# Patient Record
Sex: Male | Born: 1995 | Race: White | Hispanic: No | Marital: Single | Smoking: Never smoker
Health system: Southern US, Community
[De-identification: ages and names within clinical notes are randomized; demographics above are authoritative.]

## PROBLEM LIST (undated history)

## (undated) DIAGNOSIS — F32A Depression, unspecified: Secondary | ICD-10-CM

## (undated) DIAGNOSIS — F419 Anxiety disorder, unspecified: Secondary | ICD-10-CM

## (undated) DIAGNOSIS — I341 Nonrheumatic mitral (valve) prolapse: Secondary | ICD-10-CM

## (undated) DIAGNOSIS — F845 Asperger's syndrome: Secondary | ICD-10-CM

## (undated) DIAGNOSIS — F329 Major depressive disorder, single episode, unspecified: Secondary | ICD-10-CM

## (undated) DIAGNOSIS — T7840XA Allergy, unspecified, initial encounter: Secondary | ICD-10-CM

## (undated) DIAGNOSIS — K219 Gastro-esophageal reflux disease without esophagitis: Secondary | ICD-10-CM

## (undated) DIAGNOSIS — R55 Syncope and collapse: Secondary | ICD-10-CM

## (undated) DIAGNOSIS — Q676 Pectus excavatum: Secondary | ICD-10-CM

## (undated) DIAGNOSIS — R569 Unspecified convulsions: Secondary | ICD-10-CM

## (undated) HISTORY — DX: Anxiety disorder, unspecified: F41.9

## (undated) HISTORY — DX: Pectus excavatum: Q67.6

## (undated) HISTORY — DX: Depression, unspecified: F32.A

## (undated) HISTORY — DX: Asperger's syndrome: F84.5

## (undated) HISTORY — DX: Unspecified convulsions: R56.9

## (undated) HISTORY — DX: Major depressive disorder, single episode, unspecified: F32.9

## (undated) HISTORY — DX: Nonrheumatic mitral (valve) prolapse: I34.1

## (undated) HISTORY — DX: Gastro-esophageal reflux disease without esophagitis: K21.9

## (undated) HISTORY — DX: Allergy, unspecified, initial encounter: T78.40XA

## (undated) HISTORY — DX: Syncope and collapse: R55

---
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
## 2014-03-25 ENCOUNTER — Encounter: Payer: Self-pay | Admitting: Internal Medicine

## 2014-03-25 ENCOUNTER — Ambulatory Visit (INDEPENDENT_AMBULATORY_CARE_PROVIDER_SITE_OTHER): Payer: 59 | Admitting: Internal Medicine

## 2014-03-25 VITALS — BP 120/70 | HR 109 | Temp 97.5°F | Wt 153.0 lb

## 2014-03-25 DIAGNOSIS — H9190 Unspecified hearing loss, unspecified ear: Secondary | ICD-10-CM

## 2014-03-25 DIAGNOSIS — H9193 Unspecified hearing loss, bilateral: Secondary | ICD-10-CM

## 2014-03-25 NOTE — Progress Notes (Signed)
   Subjective:    Patient ID: Charles Copeland, male    DOB: 09/06/1995, 18 y.o.   MRN: [id number]  HPI Here with mom Has been going to the new counselor (the computer addiction expert) Has agreed he won't play computer games if grades under B  No classes this summer Quit job at [employer]  Has license and will be getting car Parents plan to have him move out and get job if he fails any more  Diagnosed with OCD as well as depression and anxiety--by the psychiatrist On the stimulant with some improvement  Maternal aunt recently had sudden hearing loss in one ear GF has hearing issues---but had shot guns  Some concern about his hearing--doesn't seem to hear parents when they talk to him Had repetitive OM as child--better after tonsils and adenoids Games with ear plugs  Current Outpatient Prescriptions on File Prior to Visit  Medication Sig Dispense Refill  . ALPRAZolam (XANAX) 0.25 MG tablet Take 1 tablet (0.25 mg total) by mouth 2 (two) times daily as needed for anxiety.  20 tablet  0  . cholecalciferol (VITAMIN D) 1000 UNITS tablet Take 1,000 Units by mouth daily.      Marland Kitchen. escitalopram (LEXAPRO) 20 MG tablet Take 1 tablet (20 mg total) by mouth daily.  90 tablet  3  . Multiple Vitamin (MULTIVITAMIN WITH MINERALS) TABS Take 1 tablet by mouth daily.       No current facility-administered medications on file prior to visit.    No Known Allergies  Past Medical History  Diagnosis Date  . Allergy   . Asthma   . Depression   . Anxiety   . MVP (mitral valve prolapse)     Past Surgical History  Procedure Laterality Date  . Tonsillectomy and adenoidectomy  2007  . Orif radial fracture  03/09    Left proximal   . Nasal cautery  02/09    in OR  . Nasal septum surgery  7/13    [facility]--Dr Evans    Family History  Problem Relation Age of Onset  . Asthma Mother   . GER disease Mother   . Eczema Mother   . Allergies Mother   . Alcohol abuse Maternal Grandfather     ?  bipolar  . Cancer Paternal Grandfather     LIVER    History   Social History  . Marital Status: Single    Spouse Name: N/A    Number of Children: N/A  . Years of Education: N/A   Occupational History  . Not on file.   Social History Main Topics  . Smoking status: Never Smoker   . Smokeless tobacco: Never Used  . Alcohol Use: No  . Drug Use: No  . Sexual Activity: Not on file   Other Topics Concern  . Not on file   Social History Narrative   Mother: retired Optometristediatrician   Father: Lab director-[employer]   One brother   No smokers in house   Review of Systems Occasional tinnitus ?vertigo sensation looking at certain patterns    Objective:   Physical Exam  Constitutional: He appears well-developed and well-nourished. No distress.  HENT:  TMs and canals normal  Eyes:  No nystagmus          Assessment & Plan:

## 2014-03-25 NOTE — Assessment & Plan Note (Signed)
Has normal hearing Seems to be a matter of attention and concentration Reassured about the status of his ears

## 2014-08-20 ENCOUNTER — Encounter: Payer: Self-pay | Admitting: Internal Medicine

## 2014-08-20 ENCOUNTER — Ambulatory Visit (INDEPENDENT_AMBULATORY_CARE_PROVIDER_SITE_OTHER): Payer: 59 | Admitting: Internal Medicine

## 2014-08-20 VITALS — BP 110/60 | HR 95 | Temp 98.4°F | Wt 176.0 lb

## 2014-08-20 DIAGNOSIS — R197 Diarrhea, unspecified: Secondary | ICD-10-CM

## 2014-08-20 DIAGNOSIS — F324 Major depressive disorder, single episode, in partial remission: Secondary | ICD-10-CM

## 2014-08-20 MED ORDER — HYOSCYAMINE SULFATE 0.125 MG SL SUBL: 0.1250 mg | SUBLINGUAL_TABLET | Freq: Two times a day (BID) | SUBLINGUAL | Status: DC | PRN

## 2014-08-20 NOTE — Patient Instructions (Signed)
Please try fiber supplements like metamucil or fibercon.  Irritable Bowel Syndrome Irritable bowel syndrome (IBS) is caused by a disturbance of normal bowel function and is a common digestive disorder. You may also hear this condition called spastic colon, mucous colitis, and irritable colon. There is no cure for IBS. However, symptoms often gradually improve or disappear with a good diet, stress management, and medicine. This condition usually appears in late adolescence or early adulthood. Women develop it twice as often as men. CAUSES  After food has been digested and absorbed in the small intestine, waste material is moved into the large intestine, or colon. In the colon, water and salts are absorbed from the undigested products coming from the small intestine. The remaining residue, or fecal material, is held for elimination. Under normal circumstances, gentle, rhythmic contractions of the bowel walls push the fecal material along the colon toward the rectum. In IBS, however, these contractions are irregular and poorly coordinated. The fecal material is either retained too long, resulting in constipation, or expelled too soon, producing diarrhea. SIGNS AND SYMPTOMS  The most common symptom of IBS is abdominal pain. It is often in the lower left side of the abdomen, but it may occur anywhere in the abdomen. The pain comes from spasms of the bowel muscles happening too much and from the buildup of gas and fecal material in the colon. This pain:  Can range from sharp abdominal cramps to a dull, continuous ache.  Often worsens soon after eating.  Is often relieved by having a bowel movement or passing gas. Abdominal pain is usually accompanied by constipation, but it may also produce diarrhea. The diarrhea often occurs right after a meal or upon waking up in the morning. The stools are often soft, watery, and flecked with mucus. Other symptoms of IBS include:  Bloating.  Loss of  appetite.  Heartburn.  Backache.  Dull pain in the arms or shoulders.  Nausea.  Burping.  Vomiting.  Gas. IBS may also cause symptoms that are unrelated to the digestive system, such as:  Fatigue.  Headaches.  Anxiety.  Shortness of breath.  Trouble concentrating.  Dizziness. These symptoms tend to come and go. DIAGNOSIS  The symptoms of IBS may seem like symptoms of other, more serious digestive disorders. Your health care provider may want to perform tests to exclude these disorders.  TREATMENT Many medicines are available to help correct bowel function or relieve bowel spasms and abdominal pain. Among the medicines available are:  Laxatives for severe constipation and to help restore normal bowel habits.  Specific antidiarrheal medicines to treat severe or lasting diarrhea.  Antispasmodic agents to relieve intestinal cramps. Your health care provider may also decide to treat you with a mild tranquilizer or sedative during unusually stressful periods in your life. Your health care provider may also prescribe antidepressant medicine. The use of this medicine has been shown to reduce pain and other symptoms of IBS. Remember that if any medicine is prescribed for you, you should take it exactly as directed. Make sure your health care provider knows how well it worked for you. HOME CARE INSTRUCTIONS   Take all medicines as directed by your health care provider.  Avoid foods that are high in fat or oils, such as heavy cream, butter, frankfurters, sausage, and other fatty meats.  Avoid foods that make you go to the bathroom, such as fruit, fruit juice, and dairy products.  Cut out carbonated drinks, chewing gum, and "gassy" foods such as beans  and cabbage. This may help relieve bloating and burping.  Eat foods with bran, and drink plenty of liquids with the bran foods. This helps relieve constipation.  Keep track of what foods seem to bring on your symptoms.  Avoid  emotionally charged situations or circumstances that produce anxiety.  Start or continue exercising.  Get plenty of rest and sleep. Document Released: 08/22/2005 Document Revised: 08/27/2013 Document Reviewed: 04/11/2008 [facility] Patient Information 2015 [employer], [place]. This information is not intended to replace advice given to you by your health care provider. Make sure you discuss any questions you have with your health care provider.

## 2014-08-20 NOTE — Assessment & Plan Note (Signed)
Going on for a few months Not infectious No worry symptoms or signs of IBD---despite brother's history Sounds like IBS type situation May be related to stress but overall doing well---- job, applied and [facility] [employer], etc Don't think it is related to his meds (not new)  Discussed increased fiber Will give levsin for SL emergency use

## 2014-08-20 NOTE — Assessment & Plan Note (Signed)
Finally still seems to improving Mom is encouraged Satisfied with the psychiatrist

## 2014-08-20 NOTE — Progress Notes (Signed)
Subjective:    Patient : Charles RoanDaniel A Meding, male    DOB: 10/31/1995, 18 y.o.   MRN: [id number]  HPI Here with mom to evaluate feeling sick  Micah FlesherWent to work at [employer] Had to go home due to diarrhea Stomach is acting up  Not new according to mom 1-2 times per week--usually just in the morning No nausea or vomiting ?cramping--rectal urgency Mom has noticed some severe staining at times but he denies true incontinence No blood Cramping will be relieved by going--but then come back  Can occur at home or at work Not stressed out   No problems at night 1-2 spells of loose stool with the spells  Brother has Crohn's  Doesn't feel depressed now [facility] [employer] [facility] [employer] [facility]  Eating healthy Reasonable fiber intake  Current Outpatient Prescriptions on File Prior to Visit  Medication Sig Dispense Refill  . amphetamine-dextroamphetamine (ADDERALL) 10 MG tablet Take 10 mg by mouth 2 (two) times daily with a meal.     . cholecalciferol (VITAMIN D) 1000 UNITS tablet Take 1,000 Units by mouth daily.    Marland Kitchen. escitalopram (LEXAPRO) 20 MG tablet Take 1 tablet (20 mg total) by mouth daily. 90 tablet 3  . Multiple Vitamin (MULTIVITAMIN WITH MINERALS) TABS Take 1 tablet by mouth daily.     No current facility-administered medications on file prior to visit.    Allergies  Allergen Reactions  . Seroquel [Quetiapine Fumarate] Swelling    Excessive appetite     Past Medical History  Diagnosis Date  . Allergy   . Asthma   . Depression   . Anxiety   . MVP (mitral valve prolapse)     Past Surgical History  Procedure Laterality Date  . Tonsillectomy and adenoidectomy  2007  . Orif radial fracture  03/09    Left proximal   . Nasal cautery  02/09    in OR  . Nasal septum surgery  7/13    [facility]--Dr Evans    Family History  Problem Relation Age of Onset  . Asthma Mother   . GER disease Mother   . Eczema Mother   . Allergies Mother   . Alcohol abuse Maternal Grandfather    ? bipolar  . Cancer Paternal Grandfather     LIVER    History   Social History  . Marital Status: Single    Spouse Name: N/A    Number of Children: N/A  . Years of Education: N/A   Occupational History  . Not on file.   Social History Main Topics  . Smoking status: Never Smoker   . Smokeless tobacco: Never Used  . Alcohol Use: No  . Drug Use: No  . Sexual Activity: Not on file   Other Topics Concern  . Not on file   Social History Narrative   Mother: retired Optometristediatrician   Father: Lab director-[employer]   One brother   No smokers in house   Review of Systems Appetite up from meds---especially during brief time on zyprexa. Personality off then also Face and neck swelled also. Stopped after a week Weight is up 20# from last visit  Has been on abilify for some time--- had no GI problems with this for some time No fever Very tired--can't stay up. Has to be up at 4AM for his job    Objective:   Physical Exam  Constitutional: He appears well-nourished. No distress.  Neck: Normal range of motion. Neck supple. No thyromegaly present.  Pulmonary/Chest:  Effort normal and breath sounds normal. No respiratory distress. He has no wheezes. He has no rales.  Abdominal: Soft. Bowel sounds are normal. He exhibits no distension. There is no tenderness. There is no rebound and no guarding.          Assessment & Plan:

## 2014-08-20 NOTE — Progress Notes (Signed)
Pre visit review using our clinic review tool, if applicable. No additional management support is needed unless otherwise documented below in the visit note. 

## 2014-12-15 ENCOUNTER — Ambulatory Visit (INDEPENDENT_AMBULATORY_CARE_PROVIDER_SITE_OTHER): Payer: 59 | Admitting: Internal Medicine

## 2014-12-15 ENCOUNTER — Encounter: Payer: Self-pay | Admitting: Internal Medicine

## 2014-12-15 VITALS — BP 104/68 | HR 94 | Temp 98.5°F | Wt 187.2 lb

## 2014-12-15 DIAGNOSIS — M79671 Pain in right foot: Secondary | ICD-10-CM

## 2014-12-15 NOTE — Progress Notes (Signed)
Pre visit review using our clinic review tool, if applicable. No additional management support is needed unless otherwise documented below in the visit note. 

## 2014-12-15 NOTE — Progress Notes (Signed)
Subjective:    Patient ID: Charles Copeland, male    DOB: 10/04/1995, 19 y.o.   MRN: [id number]  HPI Here with mom due to right foot pain for a week  No known injury Soreness when trying to walk on it Mainly at distal base of metatarsals 2-4 Had blister over Achilles a week ago--like shoe caused it  Taking ibuprofen 400mg  up to tid--this has helped  It is tender Has been walking every morning per agreement with parents New walking shoes--but doesn't really like them Mows lawn in boots  Current Outpatient Prescriptions on File Prior to Visit  Medication Sig Dispense Refill  . amphetamine-dextroamphetamine (ADDERALL) 10 MG tablet Take 10 mg by mouth 2 (two) times daily with a meal.     . ARIPiprazole (ABILIFY) 5 MG tablet Take 5 mg by mouth daily.    . cholecalciferol (VITAMIN D) 1000 UNITS tablet Take 1,000 Units by mouth daily.    Marland Kitchen. escitalopram (LEXAPRO) 10 MG tablet Take 10 mg by mouth daily. Take along with the 20mg  tablet once daily    . escitalopram (LEXAPRO) 20 MG tablet Take 1 tablet (20 mg total) by mouth daily. 90 tablet 3  . Multiple Vitamin (MULTIVITAMIN WITH MINERALS) TABS Take 1 tablet by mouth daily.    . hyoscyamine (LEVSIN SL) 0.125 MG SL tablet Place 1 tablet (0.125 mg total) under the tongue 2 (two) times daily as needed. (Patient not taking: Reported on 12/15/2014) 60 tablet 0   No current facility-administered medications on file prior to visit.    Allergies  Allergen Reactions  . Remeron [Mirtazapine] Other (See Comments)    Hypotension, passed out  . Seroquel [Quetiapine Fumarate] Swelling    Excessive appetite     Past Medical History  Diagnosis Date  . Allergy   . Asthma   . Depression   . Anxiety   . MVP (mitral valve prolapse)     Past Surgical History  Procedure Laterality Date  . Tonsillectomy and adenoidectomy  2007  . Orif radial fracture  03/09    Left proximal   . Nasal cautery  02/09    in OR  . Nasal septum surgery  7/13   [facility]--Dr Evans    Family History  Problem Relation Age of Onset  . Asthma Mother   . GER disease Mother   . Eczema Mother   . Allergies Mother   . Alcohol abuse Maternal Grandfather     ? bipolar  . Cancer Paternal Grandfather     LIVER    History   Social History  . Marital Status: Single    Spouse Name: N/A  . Number of Children: N/A  . Years of Education: N/A   Occupational History  . Not on file.   Social History Main Topics  . Smoking status: Never Smoker   . Smokeless tobacco: Never Used  . Alcohol Use: No  . Drug Use: No  . Sexual Activity: Not on file   Other Topics Concern  . Not on file   Social History Narrative   Mother: retired Optometristediatrician   Father: Lab director-[employer]   One brother   No smokers in house   Review of Systems  No fever No other joint pain or swelling     Objective:   Physical Exam  Constitutional: He appears well-developed and well-nourished. No distress.  Musculoskeletal:  Puffy in right foot but no edema No tenderness along ankle or MTP/toes Mild tenderness proximal to 2nd -  4th MTPs Small pimple over Achillles tendon but not inflamed or tender          Assessment & Plan:

## 2014-12-15 NOTE — Assessment & Plan Note (Signed)
Seems mechanical Discussed using the better, new walking shoes Try bike for a while to let discomfort settle down Continue ibuprofen prn

## 2014-12-18 ENCOUNTER — Ambulatory Visit: Payer: Self-pay | Admitting: Internal Medicine

## 2014-12-19 ENCOUNTER — Ambulatory Visit (INDEPENDENT_AMBULATORY_CARE_PROVIDER_SITE_OTHER)
Admission: RE | Admit: 2014-12-19 | Discharge: 2014-12-19 | Disposition: A | Discharge status: Home / Self Care | Payer: 59 | Source: Ambulatory Visit | Attending: Family Medicine | Admitting: Family Medicine

## 2014-12-19 ENCOUNTER — Telehealth: Payer: Self-pay | Admitting: Internal Medicine

## 2014-12-19 ENCOUNTER — Ambulatory Visit (INDEPENDENT_AMBULATORY_CARE_PROVIDER_SITE_OTHER): Payer: 59 | Admitting: Family Medicine

## 2014-12-19 ENCOUNTER — Encounter: Payer: Self-pay | Admitting: Family Medicine

## 2014-12-19 VITALS — BP 118/70 | HR 101 | Temp 98.4°F | Ht 73.0 in | Wt 185.5 lb

## 2014-12-19 DIAGNOSIS — M79671 Pain in right foot: Secondary | ICD-10-CM

## 2014-12-19 NOTE — Telephone Encounter (Signed)
Please call mom Lurena JoinerRebecca with xray results at [phone]

## 2014-12-19 NOTE — Progress Notes (Signed)
Pre visit review using our clinic review tool, if applicable. No additional management support is needed unless otherwise documented below in the visit note. 

## 2014-12-19 NOTE — Addendum Note (Signed)
Addended byKerby Nora: Yoshi Vicencio E on: 12/19/2014 10:05 AM   Modules accepted: Orders

## 2014-12-19 NOTE — Progress Notes (Signed)
   Subjective:    Patient ID: Charles Copeland, male    DOB: 09/13/1995, 19 y.o.   MRN: [id number]  HPI  19 year old male pt of Dr. Karle StarchLetvak's presents with his mother for continued pain in right foot. Not getting any better. Mom feels that it may be getting worse, he has been complaining more about it.  He saw his PCP on 4/11 for similar pain  Right foot pain has been going on for  2-3 weeks now.  No known injury No redness, no warmth. Soreness when trying to walk on it. Walks barefoot at home most of time. Mainly at distal base of metatarsals 2-4 or dorsal surface. Had blister over Achilles a week ago-- shoe caused it  Has been walking every morning per agreement with parents in last few months. New walking shoes--but doesn't really like them.. Per Dr. Karle StarchLetvak's instructions he has been wearing the better shoes.  Taking ibuprofen 400mg  up to BID--this has helped.  No other joint issues. No myalgia.    No clear med SE but Abilify can cause arthralgia No family or personal history of gout.   Review of Systems  Constitutional: Negative for fever and fatigue.  Respiratory: Negative for shortness of breath.   Cardiovascular: Negative for chest pain and leg swelling.  Gastrointestinal: Negative for abdominal pain.       Objective:   Physical Exam  Constitutional: Vital signs are normal. He appears well-developed and well-nourished.  HENT:  Head: Normocephalic.  Right Ear: Hearing normal.  Left Ear: Hearing normal.  Mouth/Throat: Mucous membranes are normal.  Neck: Trachea normal. Carotid bruit is not present. No thyroid mass present.  Cardiovascular: Normal rate, regular rhythm and normal pulses.  Exam reveals no gallop, no distant heart sounds and no friction rub.   No murmur heard. No peripheral edema  Pulmonary/Chest: Effort normal and breath sounds normal. No respiratory distress.  Musculoskeletal:       Right ankle: Normal. He exhibits normal range of motion. No  tenderness.       Left ankle: Normal. He exhibits normal range of motion. No tenderness.       Right foot: There is tenderness, bony tenderness and swelling. There is normal range of motion and normal capillary refill.       Left foot: Normal.       Feet:  Circled area is area of tenderness, mild swelling , minimal erythema and warmth, no hypersensitivity.  Skin: Skin is warm, dry and intact. No rash noted.  Psychiatric: He has a normal mood and affect. His speech is normal and behavior is normal. Thought content normal.          Assessment & Plan:

## 2014-12-19 NOTE — Patient Instructions (Signed)
Increase ibuprofen 800 mg every 8 hours. We will call with X-ray results. Limit weight bearing until X-rays are back.

## 2014-12-19 NOTE — Assessment & Plan Note (Signed)
Not clearly gout.  Minimal improvement with better shoes and low dose NSAID. Diff Dx includes stress fracture, metatarsalgia ( although no pain or sole of foot), ligament strain, less likely gout.  Will start with X-rays and increase dose of ibuprofen to 800 mg every 8 hours for pain.

## 2014-12-20 NOTE — Progress Notes (Signed)
I spoke to mom today after seeing note and x-ray May need to consider Morton's neuroma--though I wouldn't recommend a rush to surgery

## 2014-12-22 ENCOUNTER — Ambulatory Visit (INDEPENDENT_AMBULATORY_CARE_PROVIDER_SITE_OTHER): Payer: 59

## 2014-12-22 ENCOUNTER — Ambulatory Visit (INDEPENDENT_AMBULATORY_CARE_PROVIDER_SITE_OTHER): Payer: 59 | Admitting: Podiatry

## 2014-12-22 VITALS — BP 114/75 | HR 77 | Resp 16 | Ht 73.0 in | Wt 185.0 lb

## 2014-12-22 DIAGNOSIS — M79671 Pain in right foot: Secondary | ICD-10-CM

## 2014-12-22 DIAGNOSIS — M84374A Stress fracture, right foot, initial encounter for fracture: Secondary | ICD-10-CM | POA: Diagnosis not present

## 2014-12-22 NOTE — Progress Notes (Signed)
   Subjective:    Patient ID: Charles Copeland, male    DOB: 01/22/1996, 19 y.o.   MRN: [id number]  HPI It started 3-4 weeks ago , all of a sudden the right foot started hurting and it has started to swell.  No injury to foot, no redness or bruising.  It is pretty sore on the second toe top of foot   Review of Systems  All other systems reviewed and are negative.      Objective:   Physical Exam: I have reviewed his past medical history medications allergies surgery social history and review of systems. Pulses are strongly palpable bilateral. Her logic sensorium is intact for since was C monofilament. Deep tendon reflexes are intact bilateral and muscle strength is 5 over 5 dorsiflexion plantar flexors and inverters everters all intrinsic musculature is intact. Orthopedic evaluation demonstrates all joints distal to the ankle before range of motion without crepitus he has pinpoint tenderness overlying the second metatarsal neck with a painful vibratory sensation right foot. Radiographic evaluation does not demonstrate any type of osseus abnormalities other than a soft tissue increase in density around the mid diaphyseal to metaphyseal region of the second metatarsal right foot.        Assessment & Plan:  Assessment: Probable stress fracture second metatarsal right foot.  Plan: Placed him in a Cam Walker with a compression anklet.

## 2015-01-19 ENCOUNTER — Ambulatory Visit (INDEPENDENT_AMBULATORY_CARE_PROVIDER_SITE_OTHER): Payer: 59

## 2015-01-19 ENCOUNTER — Ambulatory Visit (INDEPENDENT_AMBULATORY_CARE_PROVIDER_SITE_OTHER): Payer: 59 | Admitting: Podiatry

## 2015-01-19 VITALS — BP 114/76 | HR 86 | Resp 16

## 2015-01-19 DIAGNOSIS — M722 Plantar fascial fibromatosis: Secondary | ICD-10-CM

## 2015-01-19 DIAGNOSIS — M8430XD Stress fracture, unspecified site, subsequent encounter for fracture with routine healing: Secondary | ICD-10-CM

## 2015-01-19 NOTE — Progress Notes (Signed)
He presents today with his mother for follow-up of his second metatarsal of the right foot which had a stress fracture. He states that he seems to be doing much better with very little pain. He does state that he has pain on medial aspect of his ankles bilaterally and his heels.  Objective: Vital signs are stable alert and oriented 3. Reviewed his past history medications and allergies. Pulses are palpable to the bilateral foot he has mild tenderness on palpation along the medial longitudinal arch and the posterior tibial tendon of the bilateral ankles particularly on the right foot. He also has tenderness on palpation of the distal diaphyseal region of the second metatarsal right foot. Minimal edema to the right foot but more than the left. Radiographic evaluation confirms well-healing bone callus second metatarsal distal diaphyseal region. Right foot.  Assessment: Well-healing fracture second metatarsal right foot. Mild pes planus plantar fasciitis bilateral.  Plan: Discussed etiology pathology conservative versus surgical therapies I encouraged orthotics and I will follow-up with him once those come in. Otherwise he can get back into his regular shoe gear.

## 2015-02-09 ENCOUNTER — Telehealth: Payer: Self-pay | Admitting: *Deleted

## 2015-02-09 NOTE — Telephone Encounter (Signed)
Patients mother called to check status of orthtoics that were supposed to have been ordered in early May.  Please call.

## 2015-02-10 NOTE — Telephone Encounter (Signed)
Orthotics are in appointment made

## 2015-02-13 ENCOUNTER — Ambulatory Visit: Payer: 59 | Admitting: Podiatry

## 2015-02-13 ENCOUNTER — Ambulatory Visit (INDEPENDENT_AMBULATORY_CARE_PROVIDER_SITE_OTHER): Payer: 59 | Admitting: Podiatry

## 2015-02-13 DIAGNOSIS — M722 Plantar fascial fibromatosis: Secondary | ICD-10-CM

## 2015-02-13 NOTE — Patient Instructions (Signed)

## 2015-02-13 NOTE — Progress Notes (Signed)
He presents today to take up his orthotics. He was given both oral and written instructions for use in care of the orthotics we will follow-up with him in 1 month

## 2015-03-08 IMAGING — CR DG CHEST 2V
2 series · 2 of 2 positions shown · non-contrast
Comparison: 10/04/2007

CLINICAL DATA: Loss of consciousness.  Syncope.  History of mitral
valve prolapse and asthma.  Prior syncopal episodes.

CHEST - 2 VIEW

[w chest pa *]
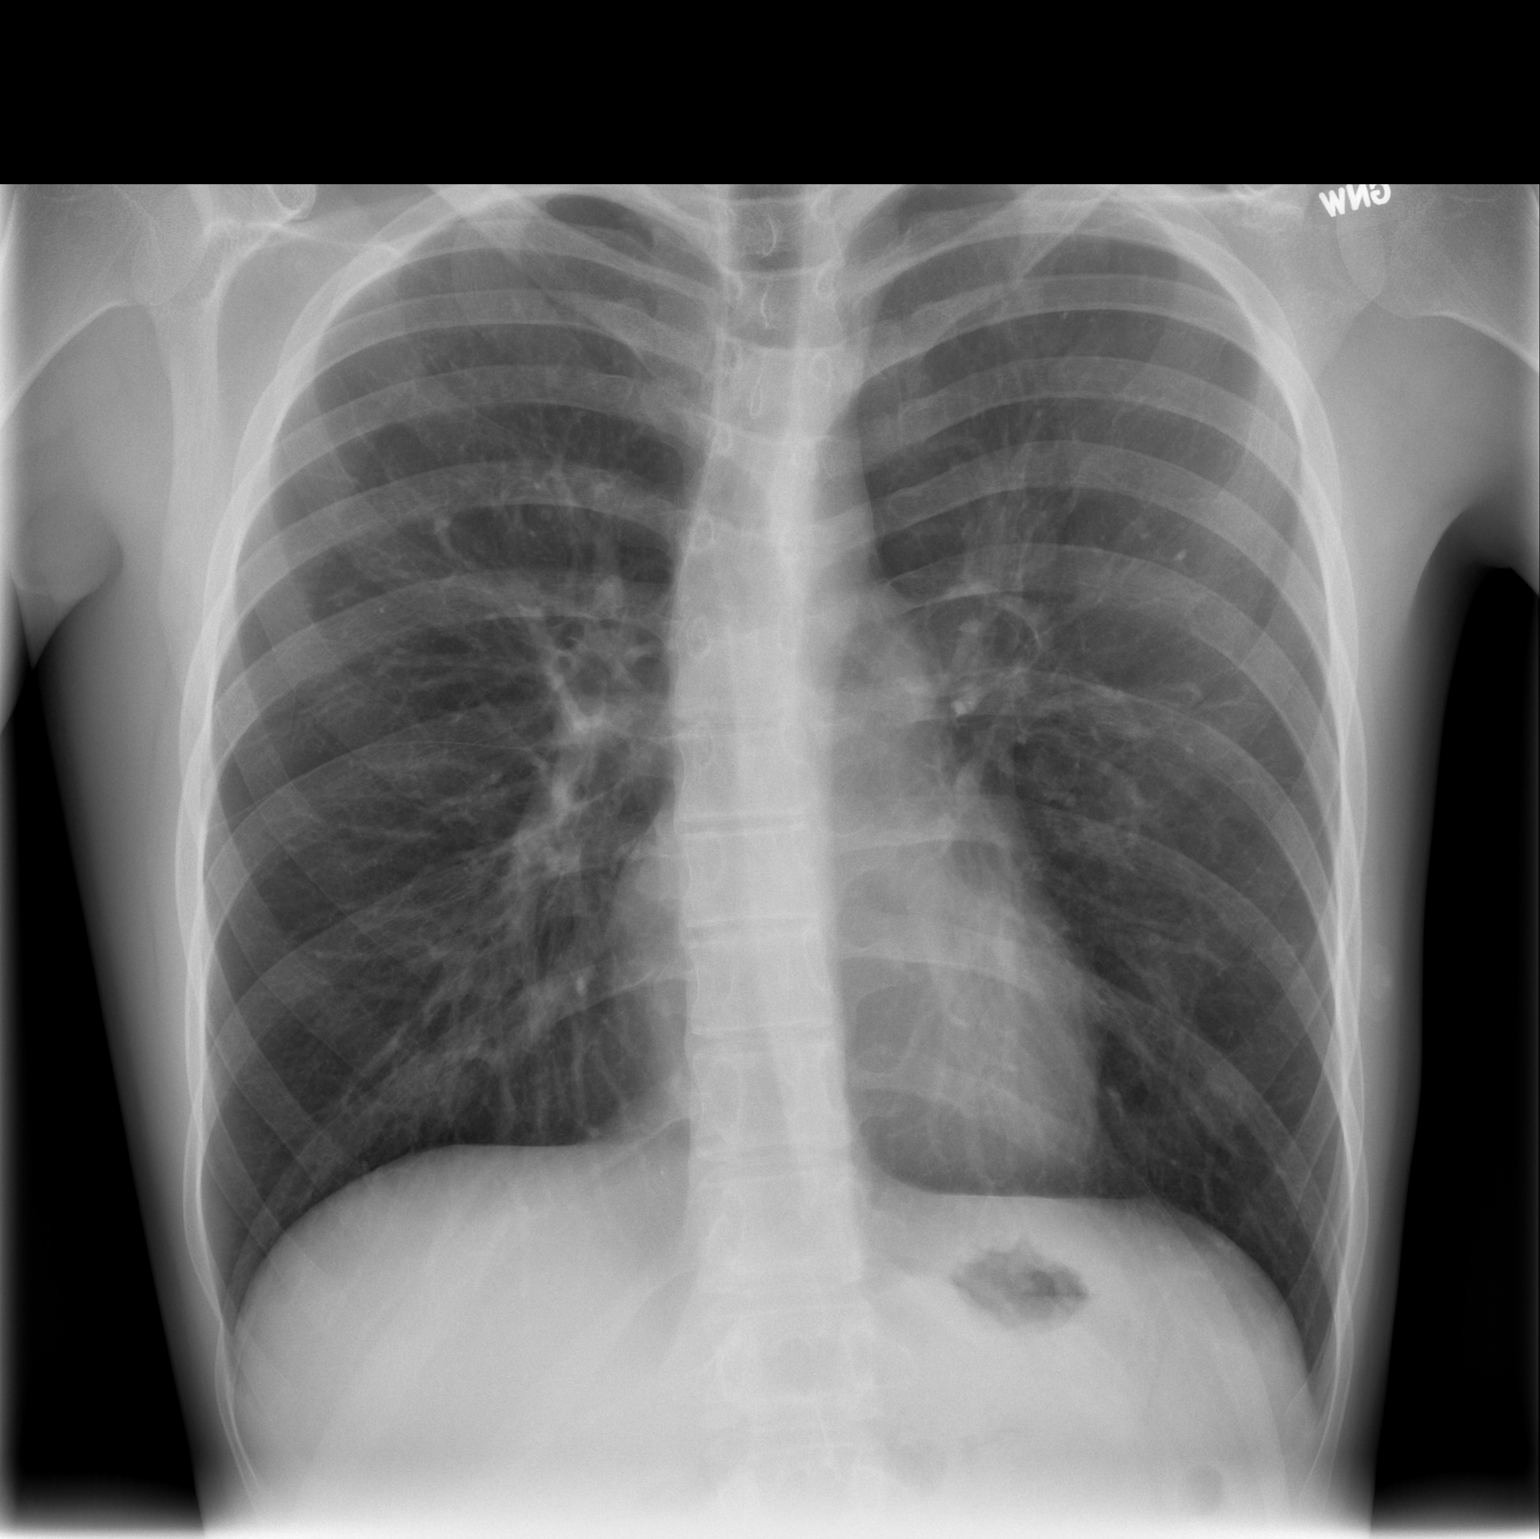

[w chest lat *]
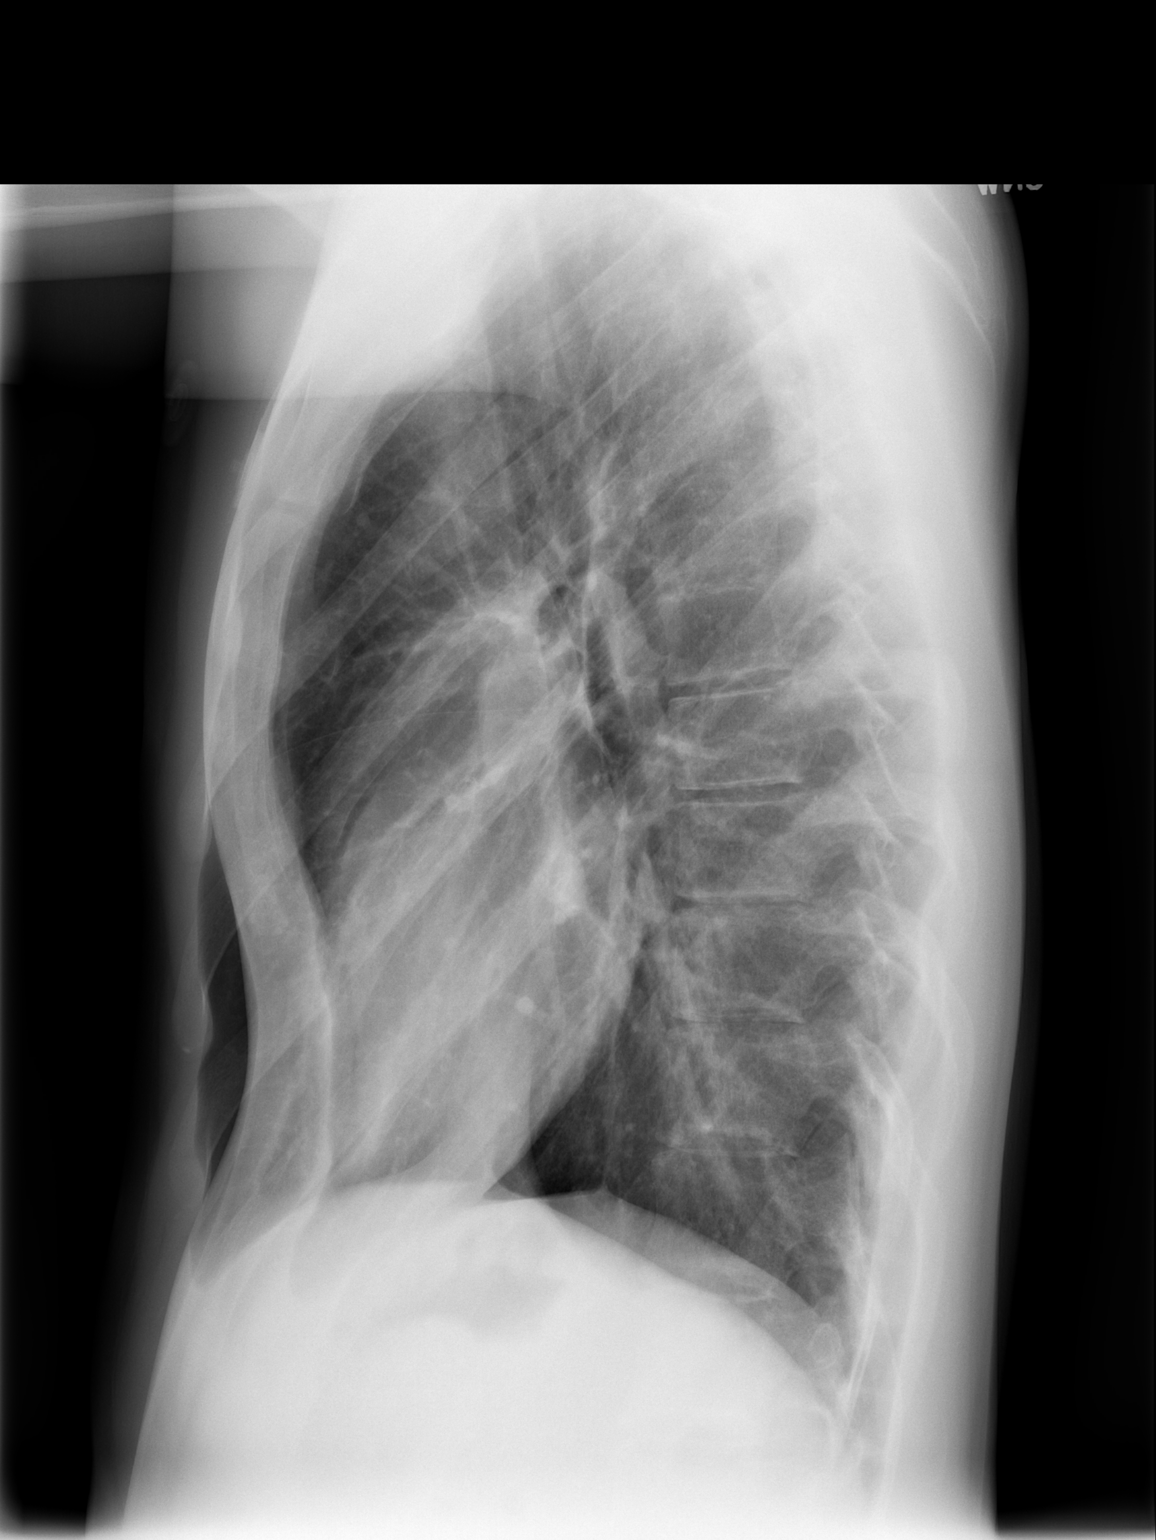

[2 of 2 positions shown; findings below may reference images not displayed]

FINDINGS: Mild thoracic scoliosis convex towards the right.  Mild
hyperinflation.  Pectus excavatum. The heart size and pulmonary
vascularity are normal. The lungs appear clear and expanded without
focal air space disease or consolidation. No blunting of the
costophrenic angles.  No pneumothorax.  Mediastinal contours appear
intact.
IMPRESSION: Pulmonary hyperinflation.  No evidence of active pulmonary disease.

## 2015-03-23 ENCOUNTER — Ambulatory Visit: Payer: 59 | Admitting: Podiatry

## 2015-05-13 ENCOUNTER — Ambulatory Visit (INDEPENDENT_AMBULATORY_CARE_PROVIDER_SITE_OTHER): Payer: 59

## 2015-05-13 DIAGNOSIS — Z23 Encounter for immunization: Secondary | ICD-10-CM | POA: Diagnosis not present

## 2015-12-14 ENCOUNTER — Ambulatory Visit (HOSPITAL_COMMUNITY)
Admission: RE | Admit: 2015-12-14 | Discharge: 2015-12-14 | Disposition: A | Discharge status: Home / Self Care | Payer: 59 | Attending: Psychiatry | Admitting: Psychiatry

## 2015-12-14 DIAGNOSIS — F429 Obsessive-compulsive disorder, unspecified: Secondary | ICD-10-CM | POA: Diagnosis present

## 2015-12-14 DIAGNOSIS — F330 Major depressive disorder, recurrent, mild: Secondary | ICD-10-CM | POA: Insufficient documentation

## 2015-12-14 DIAGNOSIS — I341 Nonrheumatic mitral (valve) prolapse: Secondary | ICD-10-CM | POA: Insufficient documentation

## 2015-12-14 DIAGNOSIS — F901 Attention-deficit hyperactivity disorder, predominantly hyperactive type: Secondary | ICD-10-CM | POA: Insufficient documentation

## 2015-12-14 DIAGNOSIS — F419 Anxiety disorder, unspecified: Secondary | ICD-10-CM | POA: Diagnosis not present

## 2015-12-14 NOTE — BH Assessment (Signed)
Tele Assessment Note   Charles Copeland is single 20 y.o. Caucasian male who presents to [facility] accompanied by his mother, Charles Copeland [phone], who participated in assessment at Pt's request. Pt reports a history of depression, anxiety, OCD and ADHD. 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 When he told his parents this they became angry that Pt was playing games instead of doing his assessments. Pt had recently missed some of his assignment. Pt became very upset when confronted, cursed at his parents and harsh words were exchanged. Pt intentionally broke his glasses, curled up in a fetal position and refused to speak to his parents. Pt denies feeling depressed and says he was frustrated by the situation with the computer tonight. Pt denies recent suicidal ideation. He reports a history of suicidal thoughts but denies ever attempting suicidal or engaging in intentional self-injurious behavior. He denies homicidal ideation or any history of violence. He denies any history of psychotic symptoms. He denies any alcohol or substance use.  Pt lives with his parents and his 43 four year old brother. Pt is currently studying computer programing at [employer]. Pt's mother reports Pt attended [employer] but was unable to cope with the stress of campus life and a year and a half ago returned home and transferred to [employer]. Pt's mother is concerned that Pt talks about giving up on his classes or transferring to another school when he becomes frustrated. Pt and mother agree he has problems with organization and staying on task. Pt's mother says he spends most of his time online chatting with people about gaming and computer programs. She is eager for Pt to complete his education so he can secure employment and leave home.   Per mother, Pt saw his psychiatrist today, Dr. Janean Sark, who  encouraged Pt to be self-motivated and "pull it together." Pt is currently prescribed Lexapro 30 mg Qam, Vyvanse 30 mg Qam and Abilify 5 mg Qam. Pt reports he is compliant with medications. Pt has participated in individual outpatient therapy in the past but current does not have a therapist. Pt was held overnight at [facility] several years ago but has no history of inpatient psychiatric treatment.  Pt is casually dressed and well-groomed. He is alert, oriented x4 with normal speech and normal motor behavior. Eye contact is fair. Pt's mood is sullen and affect is congruent with mood. Thought process is coherent and relevant. There is no indication Pt is currently responding to internal stimuli or experiencing delusional thought content. Pt was cooperative throughout assessment.    Diagnosis: Major Depressive Disorder, Recurrent, Mild; Obsessive Compulsive Disorder, Attention Deficit Hyperactivity Disorder  Past Medical History:  Past Medical History  Diagnosis Date  . Allergy   . Asthma   . Depression   . Anxiety   . MVP (mitral valve prolapse)     Past Surgical History  Procedure Laterality Date  . Tonsillectomy and adenoidectomy  2007  . Orif radial fracture  03/09    Left proximal   . Nasal cautery  02/09    in OR  . Nasal septum surgery  7/13    [facility]--Dr Logan Bores    Family History:  Family History  Problem Relation Age of Onset  . Asthma Mother   . GER disease Mother   . Eczema Mother   . Allergies Mother   .  Alcohol abuse Maternal Grandfather     ? bipolar  . Cancer Paternal Grandfather     LIVER    Social History:  reports that he has never smoked. He has never used smokeless tobacco. He reports that he does not drink alcohol or use illicit drugs.  Additional Social History:  Alcohol / Drug Use Pain Medications: Denies use Prescriptions: Denies abuse Over the Counter: Denies abuse History of alcohol / drug use?: No history of alcohol / drug  abuse Longest period of sobriety (when/how long): NA  CIWA:   COWS:    PATIENT STRENGTHS: (choose at least two) Ability for insight Average or above average intelligence Capable of independent living Communication skills Financial means General fund of knowledge Motivation for treatment/growth Physical Health Special hobby/interest Supportive family/friends  Allergies:  Allergies  Allergen Reactions  . Remeron [Mirtazapine] Other (See Comments)    Hypotension, passed out  . Seroquel [Quetiapine Fumarate] Swelling    Excessive appetite     Home Medications:  (Not in a hospital admission)  OB/GYN Status:  No LMP for male patient.  General Assessment Data [facility] Assessment: In system Is this a Tele or Face-to-Face Assessment?: Face-to-Face Is this an Initial Assessment or a Re-assessment for this encounter?: Initial Assessment Marital status: Single Maiden name: NA Is patient pregnant?: No Pregnancy Status: No Living Arrangements: Parent, Other relatives (Parents, brother (24)) Can pt return to current living arrangement?: Yes Admission Status: Voluntary Is patient capable of signing voluntary admission?: Yes Referral Source: Self/Family/Friend Insurance type: Research officer, [employer]  Medical Screening Exam [facility] Walk-in ONLY) Medical Exam completed: No Reason for MSE not completed: Patient Refused  Crisis Care Plan Living Arrangements: Parent, Other relatives (Parents, brother (24)) Legal Guardian:  (None) Name of Psychiatrist: Dr. Janean Sark. [place] Name of Therapist: None  Education Status Is patient currently in school?: Yes Current Grade: 14 Highest grade of school patient has completed: 4313 Name of school: [employer] Contact person: NA  Risk to self with the past 6 months Suicidal Ideation: No Has patient been a risk to self within the past 6 months prior to admission? : No Suicidal Intent: No Has patient had any  suicidal intent within the past 6 months prior to admission? : No Is patient at risk for suicide?: No Suicidal Plan?: No Has patient had any suicidal plan within the past 6 months prior to admission? : No Access to Means: No What has been your use of drugs/alcohol within the last 12 months?: Pt denies Previous Attempts/Gestures: No How many times?: 0 Other Self Harm Risks: None Triggers for Past Attempts: None known Intentional Self Injurious Behavior: None Family Suicide History: No Recent stressful life event(s): Conflict (Comment) (Cnoflict with parents) Persecutory voices/beliefs?: No Depression: Yes Depression Symptoms: Guilt, Feeling angry/irritable Substance abuse history and/or treatment for substance abuse?: No Suicide prevention information given to non-admitted patients: Yes  Risk to Others within the past 6 months Homicidal Ideation: No Does patient have any lifetime risk of violence toward others beyond the six months prior to admission? : No Thoughts of Harm to Others: No Current Homicidal Intent: No Current Homicidal Plan: No Access to Homicidal Means: No Identified Victim: None History of harm to others?: No Assessment of Violence: None Noted Violent Behavior Description: Pt denies history of violence Does patient have access to weapons?: No Criminal Charges Pending?: No Does patient have a court date: No Is patient on probation?: No  Psychosis Hallucinations: None noted Delusions: None noted  Mental Status Report Appearance/Hygiene: Other (Comment) (Casually dressed) Eye Contact: Good Motor Activity: Unremarkable Speech: Logical/coherent Level of Consciousness: Alert Mood: Sullen Affect: Appropriate to circumstance Anxiety Level: None Thought Processes: Coherent, Relevant Judgement: Unimpaired Orientation: Person, Place, Time, Situation, Appropriate for developmental age Obsessive Compulsive Thoughts/Behaviors: None  Cognitive  Functioning Concentration: Normal Memory: Recent Intact, Remote Intact IQ: Average Insight: Good Impulse Control: Good Appetite: Good Weight Loss: 0 Weight Gain: 0 Sleep: No Change Total Hours of Sleep: 8 Vegetative Symptoms: None  ADLScreening [facility] Assessment Services) Patient's cognitive ability adequate to safely complete daily activities?: Yes Patient able to express need for assistance with ADLs?: Yes Independently performs ADLs?: Yes (appropriate for developmental age)  Prior Inpatient Therapy Prior Inpatient Therapy: Yes Prior Therapy Dates: 2014 Prior Therapy Facilty/Provider(s): [facility] overnight Reason for Treatment: Suicidal ideation  Prior Outpatient Therapy Prior Outpatient Therapy: Yes Prior Therapy Dates: Current Prior Therapy Facilty/Provider(s): Janean Sark, MD Reason for Treatment: Depression, anxiety, OCD Does patient have an ACCT team?: No Does patient have Intensive In-House Services?  : No Does patient have [facility] services? : No Does patient have [facility] services?: No  ADL Screening (condition at time of admission) Patient's cognitive ability adequate to safely complete daily activities?: Yes Is the patient deaf or have difficulty hearing?: No Does the patient have difficulty seeing, even when wearing glasses/contacts?: No Does the patient have difficulty concentrating, remembering, or making decisions?: No Patient able to express need for assistance with ADLs?: Yes Does the patient have difficulty dressing or bathing?: No Independently performs ADLs?: Yes (appropriate for developmental age) Does the patient have difficulty walking or climbing stairs?: No Weakness of Legs: None Weakness of Arms/Hands: None  Home Assistive Devices/Equipment Home Assistive Devices/Equipment: Eyeglasses    Abuse/Neglect Assessment (Assessment to be complete while patient is alone) Physical Abuse: Denies Verbal Abuse: Denies Sexual Abuse: Denies Exploitation of  patient/patient's resources: Denies Self-Neglect: Denies     Merchant navy officer (For Healthcare) Does patient have an advance directive?: No Would patient like information on creating an advanced directive?: No - patient declined information    Additional Information 1:1 In Past 12 Months?: No CIRT Risk: No Elopement Risk: No Does patient have medical clearance?: No     Disposition: Consulted with Donell Sievert, PA who said Pt does not meet criteria for inpatient psychiatric treatment and recommends Pt follow up with his current provider. Pt contracts for safety and given suicide prevention information and 24-hour crisis numbers. Pt agrees to follow up with his current provider, Dr. Janean Sark. Pt's mother has no concerns for Pt's safety at this time. Pt refused medical screening exam and sign MSE Refusal Form.  Disposition Initial Assessment Completed for this Encounter: Yes Disposition of Patient: Outpatient treatment Type of outpatient treatment: Adult (Current provider)   Pamalee Leyden, [facility], [facility], [facility] Triage Specialist [phone]   Patsy Baltimore, Harlin Rain 12/14/2015 11:19 PM

## 2015-12-15 ENCOUNTER — Telehealth: Payer: Self-pay

## 2015-12-15 NOTE — Telephone Encounter (Signed)
Glad to hear that. He has had issues with stress and has been on medications Will reevaluate if needed (sees mental health professional--psychiatrist)

## 2015-12-15 NOTE — Telephone Encounter (Signed)
PLEASE NOTE: All timestamps contained within this report are represented as [place] Standard Time. CONFIDENTIALTY NOTICE: This fax transmission is intended only for the addressee. It contains information that is legally privileged, confidential or otherwise protected from use or disclosure. If you are not the intended recipient, you are strictly prohibited from reviewing, disclosing, copying using or disseminating any of this information or taking any action in reliance on or regarding this information. If you have received this fax in error, please notify us immediately by telephone so that we can arrange for its return to [place]. Phone: [phone], Toll-Free: [phone], Fax: [phone] Page: 1 of 2 Call Id: [id number] [facility] Night - Client TELEPHONE ADVICE RECORD [facility] Medical Call Center Patient Name: Charles Copeland Gender: Male DOB: 11-03-1995 Age: 20 Y 1 M 4 D Return Phone Number: [phone] (Primary) Address: City/State/Zip: [place] Client [facility] Night - Client Client Site [facility] [place] - Night Physician Tillman Abide Contact Type Call Who Is Calling Patient / Member / Family / Caregiver Call Type Triage / Clinical Caller Name Rudie Sermons Relationship To Patient Mother Return Phone Number [phone] (Primary) Chief Complaint SUICIDE - threatening harm to self or others Reason for Call Symptomatic / Request for Health Information Initial Comment Caller states her son is having a break down and needs to know what to do. PreDisposition Did not know what to do Translation No Nurse Assessment Nurse: Roxana Hires, RN, Afton Date/Time (Eastern Time): 12/14/2015 8:21:25 PM Confirm and document reason for call. If symptomatic, describe symptoms. You must click the next button to save text entered. ---Caller states son has histroy of depression and anxiety. Has had been suicidal before. Has been stressed  due to college and he has been having trouble. Saw psychiatrist today and things went well. Came downstairs because computer was not working, he broke his glasses adn is curled up in bed right now Has the patient traveled out of the country within the last 30 days? ---Not Applicable Does the patient have any new or worsening symptoms? ---Yes Will a triage be completed? ---Yes Related visit to physician within the last 2 weeks? ---Yes Does the PT have any chronic conditions? (i.e. diabetes, asthma, etc.) ---Yes List chronic conditions. ---depression, anxiety Is this a behavioral health or substance abuse call? ---Yes Are you having any thoughts or feelings of harming or killing yourself or someone else? ---No Are you currently experiencing any physical discomfort that you think may be related to the use of alcohol or other drugs? (use substance abuse or alcohol abuse guidelines. These include withdrawal symptoms) ---No Do you worry that you may be hearing or seeing things that others do not? ---No Do you take medications for your condition(s)? ---Yes PLEASE NOTE: All timestamps contained within this report are represented as [place] Standard Time. CONFIDENTIALTY NOTICE: This fax transmission is intended only for the addressee. It contains information that is legally privileged, confidential or otherwise protected from use or disclosure. If you are not the intended recipient, you are strictly prohibited from reviewing, disclosing, copying using or disseminating any of this information or taking any action in reliance on or regarding this information. If you have received this fax in error, please notify us immediately by telephone so that we can arrange for its return to [place]. Phone: [phone], Toll-Free: [phone], Fax: [phone] Page: 2 of 2 Call Id: [id number] Nurse Assessment List medications here. ---lexapro  abilify , vyvaance Guidelines Guideline Title Affirmed  Question Affirmed  Notes Nurse Date/Time Lamount Cohen(Eastern Time) Anxiety and Panic Attack Patient sounds very sick or weak to the triager Zellers, RN, Afton 12/14/2015 8:24:27 PM Disp. Time Lamount Cohen(Eastern Time) Disposition Final User 12/14/2015 8:19:56 PM Send To RN Personal Lyda PeroneHooper, Lance 12/14/2015 8:29:33 PM Called On-Call Provider Zellers, RN, Afton 12/14/2015 8:33:47 PM Go to ED Now (or PCP triage) Yes Zellers, RN, Afton Caller Understands: Yes Disagree/Comply: Comply Care Advice Given Per Guideline GO TO ED NOW (OR PCP TRIAGE): [facility]errals [facility] Paging DoctorName Phone DateTime Result/Outcome Message Type Notes Cheryll CockayneBurns, Stacy [phone] 12/14/2015 8:29:33 PM Called On Call Provider - Reached Doctor Paged Cheryll CockayneBurns, Stacy 12/14/2015 8:33:22 PM Spoke with On Call - General Message Result spoke with oncall, stated to go to ER and be referred to behavioral health facility

## 2015-12-15 NOTE — Telephone Encounter (Signed)
Charles Copeland said that pt was evaluated at behavorial health on 12/14/15 and was sent home and was determined no harm to himself or anyone else.pt feeling better this AM and is at school talking with counselors this morning. pts mom said pt is having finals now and is just overwhelmed at school. Charles Copeland will cb when needed.

## 2016-05-13 ENCOUNTER — Ambulatory Visit (INDEPENDENT_AMBULATORY_CARE_PROVIDER_SITE_OTHER): Payer: 59 | Admitting: Internal Medicine

## 2016-05-13 ENCOUNTER — Encounter: Payer: Self-pay | Admitting: Internal Medicine

## 2016-05-13 VITALS — BP 104/70 | HR 62 | Temp 97.4°F | Ht 73.0 in | Wt 183.0 lb

## 2016-05-13 DIAGNOSIS — F321 Major depressive disorder, single episode, moderate: Secondary | ICD-10-CM | POA: Diagnosis not present

## 2016-05-13 DIAGNOSIS — Z0001 Encounter for general adult medical examination with abnormal findings: Secondary | ICD-10-CM | POA: Diagnosis not present

## 2016-05-13 DIAGNOSIS — B359 Dermatophytosis, unspecified: Secondary | ICD-10-CM | POA: Diagnosis not present

## 2016-05-13 DIAGNOSIS — Z23 Encounter for immunization: Secondary | ICD-10-CM

## 2016-05-13 DIAGNOSIS — Z0000 Encounter for general adult medical examination without abnormal findings: Secondary | ICD-10-CM

## 2016-05-13 MED ORDER — KETOCONAZOLE 2 % EX CREA: 1.0000 "application " | TOPICAL_CREAM | Freq: Every day | CUTANEOUS | 1 refills | Status: DC

## 2016-05-13 NOTE — Assessment & Plan Note (Signed)
Will try ketoconazole If ineffective, will try systemic fluconazole

## 2016-05-13 NOTE — Addendum Note (Signed)
Addended by: Eual FinesBRIDGES, SHANNON P on: 05/13/2016 03:37 PM   Modules accepted: Orders

## 2016-05-13 NOTE — Progress Notes (Signed)
Subjective:    Patient ID: Charles Copeland, male    DOB: June 11, 1996, 20 y.o.   MRN: [id number]  HPI Here for physical In school [employer]-- computer programming (4 classes) Not working now  [place] [facility] [place] --Dr Shane Crutch Mixed anxiety and depression Current regimen is working well Parents are relatively satisfied Has car and is driving  Has been exercising daily--walking Sleeping pretty well Basically still stays at home other than school--has internet connections  Current Outpatient Prescriptions on File Prior to Visit  Medication Sig Dispense Refill  . amphetamine-dextroamphetamine (ADDERALL) 10 MG tablet Take 10 mg by mouth 2 (two) times daily with a meal.     . ARIPiprazole (ABILIFY) 5 MG tablet Take 5 mg by mouth daily.    . cholecalciferol (VITAMIN D) 1000 UNITS tablet Take 1,000 Units by mouth daily.    Marland Kitchen escitalopram (LEXAPRO) 10 MG tablet Take 10 mg by mouth daily. Take along with the 20mg  tablet once daily    . escitalopram (LEXAPRO) 20 MG tablet Take 1 tablet (20 mg total) by mouth daily. 90 tablet 3  . fexofenadine (ALLEGRA) 180 MG tablet Take 180 mg by mouth daily as needed.     . Multiple Vitamin (MULTIVITAMIN WITH MINERALS) TABS Take 1 tablet by mouth daily.     No current facility-administered medications on file prior to visit.     Allergies  Allergen Reactions  . Remeron [Mirtazapine] Other (See Comments)    Hypotension, passed out  . Seroquel [Quetiapine Fumarate] Swelling    Excessive appetite     Past Medical History:  Diagnosis Date  . Allergy   . Anxiety   . Asthma   . Depression   . MVP (mitral valve prolapse)     Past Surgical History:  Procedure Laterality Date  . nasal cautery  02/09   in OR  . NASAL SEPTUM SURGERY  7/13   [facility]--Dr Evans  . ORIF RADIAL FRACTURE  03/09   Left proximal   . TONSILLECTOMY AND ADENOIDECTOMY  2007    Family History  Problem Relation Age of Onset  . Asthma Mother   . GER disease  Mother   . Eczema Mother   . Allergies Mother   . Alcohol abuse Maternal Grandfather     ? bipolar  . Cancer Paternal Grandfather     LIVER    Social History   Social History  . Marital status: Single    Spouse name: N/A  . Number of children: N/A  . Years of education: N/A   Occupational History  . Student --[employer]    Social History Main Topics  . Smoking status: Never Smoker  . Smokeless tobacco: Never Used  . Alcohol use No  . Drug use: No  . Sexual activity: Not on file   Other Topics Concern  . Not on file   Social History Narrative   Mother: retired Optometrist   Father: Lab director-[employer]   One brother   No smokers in house   Review of Systems  Constitutional: Negative for fatigue and unexpected weight change.       Wears seat belt  HENT: Negative for dental problem, hearing loss and tinnitus.        Keeps up with dentist  Eyes: Negative for visual disturbance.       No diplopia or unilateral vision loss  Respiratory: Negative for cough, chest tightness, shortness of breath and wheezing.   Cardiovascular: Negative for chest pain, palpitations and leg  swelling.  Gastrointestinal: Negative for abdominal pain, blood in stool, constipation and nausea.       Occ loose stools  Endocrine: Negative for polydipsia and polyuria.  Genitourinary: Negative for difficulty urinating, dysuria and urgency.  Musculoskeletal: Positive for arthralgias. Negative for joint swelling.       Left shoulder pops at times--- occasional pain Back and right shoulder also pop at times  Skin: Positive for rash.       Ringworm on left shoulder--using OTC antifungal  Allergic/Immunologic: Negative for environmental allergies and immunocompromised state.  Neurological: Negative for dizziness, syncope, light-headedness and headaches.  Hematological: Negative for adenopathy. Does not bruise/bleed easily.  Psychiatric/Behavioral: Negative for sleep disturbance.       Objective:    Physical Exam  Constitutional: He appears well-developed and well-nourished. No distress.  HENT:  Head: Normocephalic and atraumatic.  Right Ear: External ear normal.  Left Ear: External ear normal.  Mouth/Throat: Oropharynx is clear and moist. No oropharyngeal exudate.  Eyes: Conjunctivae are normal. Pupils are equal, round, and reactive to light.  Neck: Normal range of motion. Neck supple. No thyromegaly present.  Cardiovascular: Normal rate, regular rhythm, normal heart sounds and intact distal pulses.  Exam reveals no gallop.   No murmur heard. Pulmonary/Chest: Effort normal and breath sounds normal. No respiratory distress. He has no wheezes. He has no rales.  Abdominal: Soft. There is no tenderness.  Genitourinary:  Genitourinary Comments: Testes normal  Musculoskeletal: He exhibits no edema or tenderness.  Normal ROM in shoulders--he can produce a noticeable clunk  Lymphadenopathy:    He has no cervical adenopathy.  Neurological: He is alert.  Skin: No erythema.  Several classic ringworm areas on lateral left arm  Psychiatric: He has a normal mood and affect. His behavior is normal.          Assessment & Plan:

## 2016-05-13 NOTE — Assessment & Plan Note (Signed)
Fairly healthy Discussed fitness and adding resistance training (can help stabilize shoulder, etc) Td

## 2016-05-13 NOTE — Progress Notes (Signed)
Pre visit review using our clinic review tool, if applicable. No additional management support is needed unless otherwise documented below in the visit note. 

## 2016-05-13 NOTE — Assessment & Plan Note (Signed)
Doing better with regimen from psychiatrist In school full time! Will check labs requested by psychiatrist

## 2016-05-14 LAB — COMPREHENSIVE METABOLIC PANEL
A/G RATIO: 1.7 (ref 1.2–2.2)
ALT: 16 IU/L (ref 0–44)
AST: 20 IU/L (ref 0–40)
Albumin: 4.7 g/dL (ref 3.5–5.5)
Alkaline Phosphatase: 50 IU/L (ref 39–117)
BUN/Creatinine Ratio: 24 — ABNORMAL HIGH (ref 9–20)
BUN: 20 mg/dL (ref 6–20)
Bilirubin Total: 0.5 mg/dL (ref 0.0–1.2)
CALCIUM: 9.6 mg/dL (ref 8.7–10.2)
CO2: 25 mmol/L (ref 18–29)
Chloride: 100 mmol/L (ref 96–106)
Creatinine, Ser: 0.84 mg/dL (ref 0.76–1.27)
GFR calc Af Amer: 145 mL/min/{1.73_m2} (ref >59)
GFR, EST NON AFRICAN AMERICAN: 125 mL/min/{1.73_m2} (ref >59)
GLOBULIN, TOTAL: 2.8 g/dL (ref 1.5–4.5)
Glucose: 98 mg/dL (ref 65–99)
POTASSIUM: 4.6 mmol/L (ref 3.5–5.2)
SODIUM: 141 mmol/L (ref 134–144)
Total Protein: 7.5 g/dL (ref 6.0–8.5)

## 2016-05-14 LAB — URINALYSIS, COMPLETE
BILIRUBIN UA: NEGATIVE
GLUCOSE, UA: NEGATIVE
KETONES UA: NEGATIVE
Leukocytes, UA: NEGATIVE
Nitrite, UA: NEGATIVE
Protein, UA: NEGATIVE
RBC UA: NEGATIVE
UUROB: 0.2 mg/dL (ref 0.2–1.0)
pH, UA: 6.5 (ref 5.0–7.5)

## 2016-05-14 LAB — MICROSCOPIC EXAMINATION
Casts: NONE SEEN /lpf
Epithelial Cells (non renal): NONE SEEN /hpf (ref 0–10)

## 2016-05-14 LAB — LIPID PANEL
CHOLESTEROL TOTAL: 219 mg/dL — AB (ref 100–199)
Chol/HDL Ratio: 4.9 ratio units (ref 0.0–5.0)
HDL: 45 mg/dL (ref >39)
LDL CALC: 150 mg/dL — AB (ref 0–99)
Triglycerides: 121 mg/dL (ref 0–149)
VLDL Cholesterol Cal: 24 mg/dL (ref 5–40)

## 2016-05-14 LAB — CBC WITH DIFFERENTIAL/PLATELET
BASOS: 0 %
Basophils Absolute: 0 10*3/uL (ref 0.0–0.2)
EOS (ABSOLUTE): 0.2 10*3/uL (ref 0.0–0.4)
Eos: 4 %
HEMOGLOBIN: 14.9 g/dL (ref 12.6–17.7)
Hematocrit: 42.7 % (ref 37.5–51.0)
IMMATURE GRANULOCYTES: 0 %
Immature Grans (Abs): 0 10*3/uL (ref 0.0–0.1)
Lymphocytes Absolute: 1.6 10*3/uL (ref 0.7–3.1)
Lymphs: 31 %
MCH: 30.3 pg (ref 26.6–33.0)
MCHC: 34.9 g/dL (ref 31.5–35.7)
MCV: 87 fL (ref 79–97)
MONOS ABS: 0.5 10*3/uL (ref 0.1–0.9)
Monocytes: 10 %
NEUTROS ABS: 2.8 10*3/uL (ref 1.4–7.0)
NEUTROS PCT: 55 %
PLATELETS: 229 10*3/uL (ref 150–379)
RBC: 4.92 x10E6/uL (ref 4.14–5.80)
RDW: 12.8 % (ref 12.3–15.4)
WBC: 5.1 10*3/uL (ref 3.4–10.8)

## 2016-05-14 LAB — TSH: TSH: 0.99 u[IU]/mL (ref 0.450–4.500)

## 2017-02-15 ENCOUNTER — Telehealth: Payer: Self-pay

## 2017-02-15 NOTE — Telephone Encounter (Signed)
Pt mom left v/m; pt last seen 05/13/16; Pt has depression and anxiety; pt cannot see psychiatrist in [place] until 02/20/17; last few months some of pts meds have been stopped due to ticks; in last 48 hours pt has become more depressed; pt has broken 2 prs of glasses because pt said that was all he knew to do. No SI/HI. pts mom wants Dr Alphonsus SiasLetvak to see pt for eval if needs medication or should pt go to behavorial health.Please advise.

## 2017-02-15 NOTE — Telephone Encounter (Signed)
Can add on today at 12:45 if needed-----I have appts tomorrow and that would be better if mom thinks he can wait till then

## 2017-02-15 NOTE — Telephone Encounter (Signed)
Will discuss at OV tomorrow

## 2017-02-15 NOTE — Telephone Encounter (Signed)
I spoke with pts mom (DPR signed) and she said it will be fine to schedule appt on 02/16/17 at 2 pm. If pt condition changes or worsens prior to appt pts mom will cb. Pt no longer takes abilify due to tics. Pt taking lexapro 30 mg daily.  FYI to Dr Alphonsus SiasLetvak.

## 2017-02-16 ENCOUNTER — Encounter: Payer: Self-pay | Admitting: Internal Medicine

## 2017-02-16 ENCOUNTER — Ambulatory Visit (INDEPENDENT_AMBULATORY_CARE_PROVIDER_SITE_OTHER): Payer: 59 | Admitting: Internal Medicine

## 2017-02-16 VITALS — BP 100/68 | HR 73 | Temp 98.1°F | Wt 180.0 lb

## 2017-02-16 DIAGNOSIS — F322 Major depressive disorder, single episode, severe without psychotic features: Secondary | ICD-10-CM | POA: Diagnosis not present

## 2017-02-16 MED ORDER — RISPERIDONE 1 MG PO TABS: 1.0000 mg | ORAL_TABLET | Freq: Every day | ORAL | 1 refills | Status: DC

## 2017-02-16 NOTE — Progress Notes (Signed)
Subjective:    Patient ID: Charles Copeland, male    DOB: 09/14/1995, 21 y.o.   MRN: [id number]  HPI  Here due to worsened depression With mom  Stopped the ablilfy Had tic with blinking eye and twitching  These stopped when he stopped the abilify  Has having a lot of mood instability on the adderall Switched to vyvanse and this leveled out  Very bad yesterday "Like zombie" per mom This is much better today He denies any urge to hurt himself--no SI  Was doing well in school til the end Procrastinated getting projects started---partially due to coming off the abilify "A" until 2 weeks before the end--then wound up with a "D" "B" in the other course where there was an issue Now will be getting access for disability services Now will see counselor there regularly Taking the summer off--is bored now Spending a lot of time on line again--got involved with some bullies Broke his glasses in anger  No problems with parents of note Mom states "Reuel BoomDaniel has matured a lot" Will communicate more with them  Considering going for counselor again Looking into Marissa CalamityMickie Dew PhD in [place] now (discussed options on his insurance)  Current Outpatient Prescriptions on File Prior to Visit  Medication Sig Dispense Refill  . cholecalciferol (VITAMIN D) 1000 UNITS tablet Take 1,000 Units by mouth daily.    Marland Kitchen. escitalopram (LEXAPRO) 10 MG tablet Take 10 mg by mouth daily. Take along with the 20mg  tablet once daily    . escitalopram (LEXAPRO) 20 MG tablet Take 1 tablet (20 mg total) by mouth daily. 90 tablet 3  . fexofenadine (ALLEGRA) 180 MG tablet Take 180 mg by mouth daily as needed.     Marland Kitchen. ketoconazole (NIZORAL) 2 % cream Apply 1 application topically daily. 60 g 1  . Multiple Vitamin (MULTIVITAMIN WITH MINERALS) TABS Take 1 tablet by mouth daily.     No current facility-administered medications on file prior to visit.     Allergies  Allergen Reactions  . Remeron [Mirtazapine] Other (See  Comments)    Hypotension, passed out  . Seroquel [Quetiapine Fumarate] Swelling    Excessive appetite     Past Medical History:  Diagnosis Date  . Allergy   . Anxiety   . Asthma   . Depression   . MVP (mitral valve prolapse)     Past Surgical History:  Procedure Laterality Date  . nasal cautery  02/09   in OR  . NASAL SEPTUM SURGERY  7/13   [facility]--Dr Evans  . ORIF RADIAL FRACTURE  03/09   Left proximal   . TONSILLECTOMY AND ADENOIDECTOMY  2007    Family History  Problem Relation Age of Onset  . Asthma Mother   . GER disease Mother   . Eczema Mother   . Allergies Mother   . Alcohol abuse Maternal Grandfather        ? bipolar  . Cancer Paternal Grandfather        LIVER    Social History   Social History  . Marital status: Single    Spouse name: N/A  . Number of children: N/A  . Years of education: N/A   Occupational History  . Student --[employer]    Social History Main Topics  . Smoking status: Never Smoker  . Smokeless tobacco: Never Used  . Alcohol use No  . Drug use: No  . Sexual activity: Not on file   Other Topics Concern  . Not on file  Social History Narrative   Mother: retired Optometrist   Father: Lab director-[employer]   One brother   No smokers in house   Review of Systems Known OCD diagnosis by the psychiatrist also vyvanse helped this Appetite is okay May be sleeping a little more    Objective:   Physical Exam  Constitutional: He appears well-nourished. No distress.  Psychiatric:  Calm and controlled speech Appropriate with me and mom Still some psychomotor retardation          Assessment & Plan:

## 2017-02-16 NOTE — Assessment & Plan Note (Addendum)
Has worsened since being off the aripiprazole (which caused tics) Will be seeing Dr Betha LoaGaultieri next month Augmentation with a different atypical antipsychotic seems most appropriate Discussed with mom Will start risperidone 1mg  with increase to 2 as needed Planning for school, goal setting and counseled about meds for over 15 minutes of 25 minute visit

## 2017-02-16 NOTE — Patient Instructions (Addendum)
Please start the risperidone at 1mg  daily at bedtime. Increase to 2mg  nightly in 1 week if you are not feeling any better. Call for appointment with Dr Dellia CloudGutterman.

## 2017-03-20 ENCOUNTER — Ambulatory Visit (INDEPENDENT_AMBULATORY_CARE_PROVIDER_SITE_OTHER): Payer: 59 | Admitting: Psychology

## 2017-03-20 DIAGNOSIS — F4010 Social phobia, unspecified: Secondary | ICD-10-CM | POA: Diagnosis not present

## 2017-03-20 DIAGNOSIS — F331 Major depressive disorder, recurrent, moderate: Secondary | ICD-10-CM | POA: Diagnosis not present

## 2017-03-29 ENCOUNTER — Encounter: Payer: Self-pay | Admitting: Primary Care

## 2017-03-29 ENCOUNTER — Ambulatory Visit (INDEPENDENT_AMBULATORY_CARE_PROVIDER_SITE_OTHER): Payer: 59 | Admitting: Primary Care

## 2017-03-29 VITALS — BP 116/76 | HR 80 | Temp 98.1°F | Ht 73.0 in | Wt 173.8 lb

## 2017-03-29 DIAGNOSIS — H938X1 Other specified disorders of right ear: Secondary | ICD-10-CM

## 2017-03-29 NOTE — Patient Instructions (Signed)
Your ear is not infected. There is no wax remaining your ear canals.  Ear Pressure: Try using Flonase (fluticasone) nasal spray. Instill 1 spray in each nostril twice daily.   Start Zyrtec antihistamine at bedtime to help dry out any fluid behind the ear.  It was a pleasure meeting you!

## 2017-03-29 NOTE — Progress Notes (Signed)
Subjective:    Patient ID: Rolinda Roananiel A Ferraz, male    DOB: 10/21/1995, 21 y.o.   MRN: [id number]  HPI  Mr. Sheryle SprayValcour is a 21 year old male who presents today with a chief complaint of ear fullness. His symptoms began about 2 weeks ago. His mother was using a plastic curette and peroxide and removed a lot of ear wax several days ago. He's not taken anything OTC. He's not been swimming recently. He denies fevers, sore throat, cough.  Review of Systems  Constitutional: Negative for chills and fever.  HENT: Negative for ear pain and sore throat.        Ear fullness  Respiratory: Negative for cough.        Past Medical History:  Diagnosis Date  . Allergy   . Anxiety   . Asthma   . Depression   . MVP (mitral valve prolapse)      Social History   Social History  . Marital status: Single    Spouse name: N/A  . Number of children: N/A  . Years of education: N/A   Occupational History  . Student --[employer]    Social History Main Topics  . Smoking status: Never Smoker  . Smokeless tobacco: Never Used  . Alcohol use No  . Drug use: No  . Sexual activity: Not on file   Other Topics Concern  . Not on file   Social History Narrative   Mother: retired Optometristediatrician   Father: Lab director-[employer]   One brother   No smokers in house    Past Surgical History:  Procedure Laterality Date  . nasal cautery  02/09   in OR  . NASAL SEPTUM SURGERY  7/13   [facility]--Dr Evans  . ORIF RADIAL FRACTURE  03/09   Left proximal   . TONSILLECTOMY AND ADENOIDECTOMY  2007    Family History  Problem Relation Age of Onset  . Asthma Mother   . GER disease Mother   . Eczema Mother   . Allergies Mother   . Alcohol abuse Maternal Grandfather        ? bipolar  . Cancer Paternal Grandfather        LIVER    Allergies  Allergen Reactions  . Remeron [Mirtazapine] Other (See Comments)    Hypotension, passed out  . Seroquel [Quetiapine Fumarate] Swelling    Excessive appetite   . Aripiprazole  Other (See Comments)    tics    Current Outpatient Prescriptions on File Prior to Visit  Medication Sig Dispense Refill  . cholecalciferol (VITAMIN D) 1000 UNITS tablet Take 1,000 Units by mouth daily.    Marland Kitchen. escitalopram (LEXAPRO) 10 MG tablet Take 10 mg by mouth daily. Take along with the 20mg  tablet once daily    . escitalopram (LEXAPRO) 20 MG tablet Take 1 tablet (20 mg total) by mouth daily. 90 tablet 3  . fexofenadine (ALLEGRA) 180 MG tablet Take 180 mg by mouth daily as needed.     Marland Kitchen. ketoconazole (NIZORAL) 2 % cream Apply 1 application topically daily. 60 g 1  . Multiple Vitamin (MULTIVITAMIN WITH MINERALS) TABS Take 1 tablet by mouth daily.    . risperiDONE (RISPERDAL) 1 MG tablet Take 1-2 tablets (1-2 mg total) by mouth at bedtime. 60 tablet 1  . VYVANSE 30 MG capsule Take 1 capsule by mouth daily.     No current facility-administered medications on file prior to visit.     BP 116/76   Pulse 80  Temp 98.1 F (36.7 C) (Oral)   Ht 6\' 1"  (1.854 m)   Wt 173 lb 12.8 oz (78.8 kg)   SpO2 95%   BMI 22.93 kg/m    Objective:   Physical Exam  Constitutional: He appears well-nourished.  HENT:  Right Ear: Ear canal normal. Tympanic membrane is bulging. Tympanic membrane is not erythematous.  Left Ear: Tympanic membrane and ear canal normal.  Nose: No mucosal edema. Right sinus exhibits no maxillary sinus tenderness and no frontal sinus tenderness. Left sinus exhibits no maxillary sinus tenderness and no frontal sinus tenderness.  Mouth/Throat: Oropharynx is clear and moist.  Eyes: Conjunctivae are normal.  Neck: Neck supple.  Cardiovascular: Normal rate and regular rhythm.   Pulmonary/Chest: Effort normal and breath sounds normal. He has no wheezes. He has no rales.  Skin: Skin is warm and dry.          Assessment & Plan:  Ear Fullness:  Present for several weeks. Mother has removed a lot of ear wax. No cerumen remaining. Exam today without infection, could be  eustachian tube dysfunction vs allergy involvement. Discussed use of Flonase and Zyrtec. Follow up PRN.  Morrie Sheldonlark,Koal Eslinger Kendal, NP

## 2017-04-06 ENCOUNTER — Encounter: Payer: Self-pay | Admitting: Family Medicine

## 2017-04-06 ENCOUNTER — Ambulatory Visit (INDEPENDENT_AMBULATORY_CARE_PROVIDER_SITE_OTHER): Payer: 59 | Admitting: Family Medicine

## 2017-04-06 ENCOUNTER — Telehealth: Payer: Self-pay | Admitting: Internal Medicine

## 2017-04-06 VITALS — Temp 97.8°F | Ht 73.0 in | Wt 175.0 lb

## 2017-04-06 DIAGNOSIS — R55 Syncope and collapse: Secondary | ICD-10-CM | POA: Diagnosis not present

## 2017-04-06 NOTE — Assessment & Plan Note (Addendum)
Anticipate vasovagal episode early this morning due to possible dehydration from overexertion yesterday associated with poor diet. No seizure activity witnessed by mom. Known hypotension, with borderline orthostatics in office today.  Doubt more ominous cause.  Would monitor lexapro 30mg  dose closely - although he has been on this medication long-term. Update EKG today - unchanged from prior.  I recommended ensure good hydration status, and protein with every meal.

## 2017-04-06 NOTE — Progress Notes (Signed)
Temp 97.8 F (36.6 C) (Oral)   Ht 6\' 1"  (1.854 m)   Wt 175 lb (79.4 kg)   BMI 23.09 kg/m   Orthostatic VS for the past 24 hrs (Last 3 readings):  BP- Lying Pulse- Lying BP- Standing at 3 minutes Pulse- Standing at 3 minutes  04/06/17 0911 118/64 82 100/62 96   CC: syncope Subjective:    Patient ID: Charles Copeland, male    DOB: 18-Feb-1996, 21 y.o.   MRN: [id number]  HPI: LASHAWN Copeland is a 21 y.o. male presenting on 04/06/2017 for Loss of Consciousness   Here with mom who is a retired Optometrist.  Sees neuropsych at [facility] and Dr Dellia Cloud for anxiety.  Woke up this morning at 3:30am, didn't feel well. Got on computer, then when he got up got lightheaded. Called out to his mom who rushed upstairs and found him unresponsive on the floor (max 2 min) in a stiff position and very pale, may have bitten his tongue but no urinary incontinence. Some fleeting confusion when he regained consciousness - but mom doubts postictal as he was quickly aware of his surroundings. EMS called - BP 130/80, HR 80s, cbg 119. Drank bottle of gatorade and had a meal and felt better. Denies palpitations, vertigo.   He mowed 2 lawns yesterday, poor diet yesterday as well (ate several small bags of chips as lunch) and may not have stayed well hydrated. Mom thinks he passed out from dehydration. He also forgot to take lexapro at his normal time - so took it late last night. Didn't take vyvanse yesterday.   When he was on remeron he had similar episode of loss of consciousness but associated with tonic/clonic seizure.  Stays hypotensive - has been told to drink fluids and eat salty food.   H/o anxiety/depression - he takes lexapro 30mg  daily. He also takes vyvanse 30mg  daily for ADHD.  Off risperdal - made him feel weird.  Known MVP.   Relevant past medical, surgical, family and social history reviewed and updated as indicated. Interim medical history since our last visit reviewed. Allergies and medications  reviewed and updated. Outpatient Medications Prior to Visit  Medication Sig Dispense Refill  . cholecalciferol (VITAMIN D) 1000 UNITS tablet Take 1,000 Units by mouth daily.    Marland Kitchen escitalopram (LEXAPRO) 10 MG tablet Take 10 mg by mouth daily. Take along with the 20mg  tablet once daily    . escitalopram (LEXAPRO) 20 MG tablet Take 1 tablet (20 mg total) by mouth daily. 90 tablet 3  . fexofenadine (ALLEGRA) 180 MG tablet Take 180 mg by mouth daily as needed.     Marland Kitchen ketoconazole (NIZORAL) 2 % cream Apply 1 application topically daily. 60 g 1  . Multiple Vitamin (MULTIVITAMIN WITH MINERALS) TABS Take 1 tablet by mouth daily.    Marland Kitchen VYVANSE 30 MG capsule Take 1 capsule by mouth daily.    . risperiDONE (RISPERDAL) 1 MG tablet Take 1-2 tablets (1-2 mg total) by mouth at bedtime. (Patient not taking: Reported on 04/06/2017) 60 tablet 1   No facility-administered medications prior to visit.      Per HPI unless specifically indicated in ROS section below Review of Systems     Objective:    Temp 97.8 F (36.6 C) (Oral)   Ht 6\' 1"  (1.854 m)   Wt 175 lb (79.4 kg)   BMI 23.09 kg/m   Wt Readings from Last 3 Encounters:  04/06/17 175 lb (79.4 kg)  03/29/17 173  lb 12.8 oz (78.8 kg)  02/16/17 180 lb (81.6 kg)    Physical Exam  Constitutional: He is oriented to person, place, and time. He appears well-developed and well-nourished. No distress.  HENT:  Head: Normocephalic and atraumatic.  Mouth/Throat: Oropharynx is clear and moist. No oropharyngeal exudate.  Eyes: Pupils are equal, round, and reactive to light. Conjunctivae and EOM are normal. No scleral icterus.  Neck: Normal range of motion. Neck supple. No thyromegaly present.  Cardiovascular: Normal rate, regular rhythm, normal heart sounds and intact distal pulses.   No murmur heard. No M appreciated  Pulmonary/Chest: Effort normal and breath sounds normal. No respiratory distress. He has no wheezes. He has no rales.  Abdominal: Soft. Bowel  sounds are normal. He exhibits no distension and no mass. There is no tenderness. There is no rebound and no guarding.  Musculoskeletal: He exhibits no edema.  Lymphadenopathy:    He has no cervical adenopathy.  Neurological: He is alert and oriented to person, place, and time. He has normal strength. No cranial nerve deficit or sensory deficit. Coordination and gait normal.  CN 2-12 intact FTN intact EOMI   Skin: Skin is warm and dry. No rash noted.  Psychiatric: He has a normal mood and affect.  Nursing note and vitals reviewed.  Results for orders placed or performed in visit on 05/13/16  Microscopic Examination  Result Value Ref Range   WBC, UA 0-5 0 - 5 /hpf   RBC, UA 0-2 0 - 2 /hpf   Epithelial Cells (non renal) None seen 0 - 10 /hpf   Casts None seen None seen /lpf   Mucus, UA Present Not Estab.   Bacteria, UA Few None seen/Few  Comprehensive metabolic panel  Result Value Ref Range   Glucose 98 65 - 99 mg/dL   BUN 20 6 - 20 mg/dL   Creatinine, Ser 3.320.84 0.76 - 1.27 mg/dL   GFR calc non Af Amer 125 >59 mL/min/1.73   GFR calc Af Amer 145 >59 mL/min/1.73   BUN/Creatinine Ratio 24 (H) 9 - 20   Sodium 141 134 - 144 mmol/L   Potassium 4.6 3.5 - 5.2 mmol/L   Chloride 100 96 - 106 mmol/L   CO2 25 18 - 29 mmol/L   Calcium 9.6 8.7 - 10.2 mg/dL   Total Protein 7.5 6.0 - 8.5 g/dL   Albumin 4.7 3.5 - 5.5 g/dL   Globulin, Total 2.8 1.5 - 4.5 g/dL   Albumin/Globulin Ratio 1.7 1.2 - 2.2   Bilirubin Total 0.5 0.0 - 1.2 mg/dL   Alkaline Phosphatase 50 39 - 117 IU/L   AST 20 0 - 40 IU/L   ALT 16 0 - 44 IU/L  CBC with Differential/Platelet  Result Value Ref Range   WBC 5.1 3.4 - 10.8 x10E3/uL   RBC 4.92 4.14 - 5.80 x10E6/uL   Hemoglobin 14.9 12.6 - 17.7 g/dL   Hematocrit 95.142.7 88.437.5 - 51.0 %   MCV 87 79 - 97 fL   MCH 30.3 26.6 - 33.0 pg   MCHC 34.9 31.5 - 35.7 g/dL   RDW 16.612.8 06.312.3 - 01.615.4 %   Platelets 229 150 - 379 x10E3/uL   Neutrophils 55 %   Lymphs 31 %   Monocytes 10 %    Eos 4 %   Basos 0 %   Neutrophils Absolute 2.8 1.4 - 7.0 x10E3/uL   Lymphocytes Absolute 1.6 0.7 - 3.1 x10E3/uL   Monocytes Absolute 0.5 0.1 - 0.9 x10E3/uL   EOS (ABSOLUTE)  0.2 0.0 - 0.4 x10E3/uL   Basophils Absolute 0.0 0.0 - 0.2 x10E3/uL   Immature Granulocytes 0 %   Immature Grans (Abs) 0.0 0.0 - 0.1 x10E3/uL  Lipid panel  Result Value Ref Range   Cholesterol, Total 219 (H) 100 - 199 mg/dL   Triglycerides 098121 0 - 149 mg/dL   HDL 45 >11>39 mg/dL   VLDL Cholesterol Cal 24 5 - 40 mg/dL   LDL Calculated 914150 (H) 0 - 99 mg/dL   Chol/HDL Ratio 4.9 0.0 - 5.0 ratio units  TSH  Result Value Ref Range   TSH 0.990 0.450 - 4.500 uIU/mL  Urinalysis, Complete  Result Value Ref Range   Specific Gravity, UA      >=1.030 (A) 1.005 - 1.030   pH, UA 6.5 5.0 - 7.5   Color, UA Yellow Yellow   Appearance Ur Clear Clear   Leukocytes, UA Negative Negative   Protein, UA Negative Negative/Trace   Glucose, UA Negative Negative   Ketones, UA Negative Negative   RBC, UA Negative Negative   Bilirubin, UA Negative Negative   Urobilinogen, Ur 0.2 0.2 - 1.0 mg/dL   Nitrite, UA Negative Negative   Microscopic Examination Comment    Microscopic Examination See below:    EKG - NSR rate 60s, normal axis, intervals, no acute ST/T changes, rSR V1, ST elevation likely repolarization variant, largely unchanged from prior EKG 2014.     Assessment & Plan:   Problem List Items Addressed This Visit    Syncope - Primary    Anticipate vasovagal episode early this morning due to possible dehydration from overexertion yesterday associated with poor diet. No seizure activity witnessed by mom. Known hypotension, with borderline orthostatics in office today.  Doubt more ominous cause.  Would monitor lexapro 30mg  dose closely - although he has been on this medication long-term. Update EKG today - unchanged from prior.  I recommended ensure good hydration status, and protein with every meal.       Relevant Orders   EKG  12-Lead (Completed)       Follow up plan: Return if symptoms worsen or fail to improve.  Eustaquio BoydenJavier Jeremy Mclamb, MD

## 2017-04-06 NOTE — Patient Instructions (Addendum)
EKG today  Push fluids and rest.  Ensure protein with each meal. If recurrent episode, let us know for further evaluation.   Syncope Syncope is when you temporarily lose consciousness. Syncope may also be called fainting or passing out. It is caused by a sudden decrease in blood flow to the brain. Even though most causes of syncope are not dangerous, syncope can be a sign of a serious medical problem. Signs that you may be about to faint include:  Feeling dizzy or light-headed.  Feeling nauseous.  Seeing all white or all black in your field of vision.  Having cold, clammy skin.  If you fainted, get medical help right away.Call your local emergency services (911 in the [place] Do not drive yourself to the hospital. Follow these instructions at home: Pay attention to any changes in your symptoms. Take these actions to help with your condition:  Have someone stay with you until you feel stable.  Do not drive, use machinery, or play sports until your health care provider says it is okay.  Keep all follow-up visits as told by your health care provider. This is important.  If you start to feel like you might faint, lie down right away and raise (elevate) your feet above the level of your heart. Breathe deeply and steadily. Wait until all of the symptoms have passed.  Drink enough fluid to keep your urine clear or pale yellow.  If you are taking blood pressure or heart medicine, get up slowly and take several minutes to sit and then stand. This can reduce dizziness.  Take over-the-counter and prescription medicines only as told by your health care provider.  Get help right away if:  You have a severe headache.  You have unusual pain in your chest, abdomen, or back.  You are bleeding from your mouth or rectum, or you have black or tarry stool.  You have a very fast or irregular heartbeat (palpitations).  You have pain with breathing.  You faint once or repeatedly.  You have a  seizure.  You are confused.  You have trouble walking.  You have severe weakness.  You have vision problems. These symptoms may represent a serious problem that is an emergency. Do not wait to see if your symptoms will go away. Get medical help right away. Call your local emergency services (911 in the [place] Do not drive yourself to the hospital. This information is not intended to replace advice given to you by your health care provider. Make sure you discuss any questions you have with your health care provider. Document Released: 08/22/2005 Document Revised: 01/28/2016 Document Reviewed: 05/06/2015 Elsevier Interactive Patient Education  2017 [employer]

## 2017-04-06 NOTE — Telephone Encounter (Signed)
Pt has appt with Dr Reece AgarG 04/06/17 at 9 AM.

## 2017-04-06 NOTE — Telephone Encounter (Signed)
[facility] Day - Client TELEPHONE ADVICE RECORD [facility] Patient Name: Charles Copeland DOB: 01/08/1996 Initial Comment Caller says , son history of anxiety , depression, ADD, and low blood pressure. Mother was awoken at 3am, when son yelled Help Me, and he was found on the floor unresponsive. He eventually woke up. Says he felt odd before this happened. Ambulance was called, they evaluated him, had a meal and went to sleep. He is responsive now. Nurse Assessment Nurse: Lane HackerHarley, RN, Elvin SoWindy Date/Time (Eastern Time): 04/06/2017 8:34:19 AM Confirm and document reason for call. If symptomatic, describe symptoms. ---Caller states that she was awoken at 3:30 am when son yelled "Help Me". He got dizzy, and couldn't connect his thoughts, and he got up and started to walk towards the door, and vision was odd, and he yelled "Help Me." And then was found on the floor unresponsive. He was very stiff. He eventually woke up. And was pale, sweaty, and nauseated. Ambulance was called, they evaluated him, BP 120/80, HR 130's, blood sugar was 110. He had some fluids, and then had a meal at 5 am, and went to sleep. He is responsive now. Denies dizziness. - He had mowed 2 yards, and hadn't eaten well or drank much fluid yesterday. Mom, a doctor, feels he got dehydrated from the eat. Does the patient have any new or worsening symptoms? ---Yes Will a triage be completed? ---Yes Related visit to physician within the last 2 weeks? ---No Does the PT have any chronic conditions? (i.e. diabetes, asthma, etc.) ---Yes List chronic conditions. ---history of anxiety, depression - takes Lexapro, ADD - Vyvance, and low blood pressure. - He is really tall, and often wouldn't drink enough and would pass out. Is this a behavioral health or substance abuse call? ---No Guidelines Guideline Title Affirmed Question Affirmed Notes Heat Exposure (Heat Exhaustion and  Heat Stroke) Has fainted (passed out) Final Disposition User Call EMS 911 Now [place], [place], Elvin SoWindy Comments Caller wants an appt for son this AM. They have already called 911, VSS, and pt doing much better now. RN made appt for 9 am with Dr. Sharen HonesGutierrez as Dr. Alphonsus SiasLetvak did not have anything available Today. RN advised that if he has any problems while in route, then they should go right to the ED.  Caller verbalized understanding and agreeable. Disagree/Comply: Comply

## 2017-04-13 ENCOUNTER — Telehealth: Payer: Self-pay

## 2017-04-13 ENCOUNTER — Telehealth: Payer: Self-pay | Admitting: Internal Medicine

## 2017-04-13 NOTE — Telephone Encounter (Signed)
Left a message to call the office to see how he is doing.

## 2017-04-13 NOTE — Telephone Encounter (Signed)
Patient would like to come by and pick up immunization records for a job interview.  Please call patient when it's ready for pick up.

## 2017-04-13 NOTE — Telephone Encounter (Signed)
Spoke to pt. He said he has not had anymore episodes.

## 2017-04-13 NOTE — Telephone Encounter (Signed)
-----   Message from Karie Schwalbeichard I Letvak, MD sent at 04/10/2017  1:38 PM EDT ----- Yes, please ----- Message ----- From: Eual FinesBridges, Rion Catala P, CMA Sent: 04/10/2017   9:14 AM To: Karie Schwalbeichard I Letvak, MD  He saw Dr Reece AgarG 04-06-17. You still want me to check on him? ----- Message ----- From: Karie SchwalbeLetvak, Richard I, MD Sent: 04/07/2017   8:12 PM To: Eual FinesShannon P Maycol Hoying, CMA  Please check on him on Monday ----- Message ----- From: Eustaquio BoydenGutierrez, Javier, MD Sent: 04/06/2017   7:34 PM To: Karie Schwalbeichard I Letvak, MD

## 2017-04-13 NOTE — Telephone Encounter (Signed)
Shot record up front ready for pickup. Spoke to pt.

## 2017-04-15 ENCOUNTER — Emergency Department (HOSPITAL_COMMUNITY)
Admission: EM | Admit: 2017-04-15 | Discharge: 2017-04-15 | Disposition: A | Discharge status: Home / Self Care | Payer: 59 | Attending: Emergency Medicine | Admitting: Emergency Medicine

## 2017-04-15 ENCOUNTER — Encounter (HOSPITAL_COMMUNITY): Payer: Self-pay

## 2017-04-15 ENCOUNTER — Emergency Department (HOSPITAL_COMMUNITY): Payer: 59

## 2017-04-15 DIAGNOSIS — Z79899 Other long term (current) drug therapy: Secondary | ICD-10-CM | POA: Insufficient documentation

## 2017-04-15 DIAGNOSIS — R55 Syncope and collapse: Secondary | ICD-10-CM | POA: Insufficient documentation

## 2017-04-15 DIAGNOSIS — F900 Attention-deficit hyperactivity disorder, predominantly inattentive type: Secondary | ICD-10-CM | POA: Diagnosis not present

## 2017-04-15 LAB — URINALYSIS, ROUTINE W REFLEX MICROSCOPIC
BILIRUBIN URINE: NEGATIVE
Glucose, UA: NEGATIVE mg/dL
Hgb urine dipstick: NEGATIVE
Ketones, ur: NEGATIVE mg/dL
LEUKOCYTES UA: NEGATIVE
NITRITE: NEGATIVE
Protein, ur: NEGATIVE mg/dL
SPECIFIC GRAVITY, URINE: 1.02 (ref 1.005–1.030)
pH: 5 (ref 5.0–8.0)

## 2017-04-15 LAB — CBC
HEMATOCRIT: 41.9 % (ref 39.0–52.0)
HEMOGLOBIN: 14.5 g/dL (ref 13.0–17.0)
MCH: 30.1 pg (ref 26.0–34.0)
MCHC: 34.6 g/dL (ref 30.0–36.0)
MCV: 86.9 fL (ref 78.0–100.0)
Platelets: 202 10*3/uL (ref 150–400)
RBC: 4.82 MIL/uL (ref 4.22–5.81)
RDW: 12.8 % (ref 11.5–15.5)
WBC: 7.8 10*3/uL (ref 4.0–10.5)

## 2017-04-15 LAB — RAPID URINE DRUG SCREEN, HOSP PERFORMED
Amphetamines: POSITIVE — AB
Barbiturates: NOT DETECTED
Benzodiazepines: NOT DETECTED
Cocaine: NOT DETECTED
Opiates: NOT DETECTED
Tetrahydrocannabinol: NOT DETECTED

## 2017-04-15 LAB — BASIC METABOLIC PANEL
ANION GAP: 9 (ref 5–15)
BUN: 15 mg/dL (ref 6–20)
CALCIUM: 9.6 mg/dL (ref 8.9–10.3)
CO2: 28 mmol/L (ref 22–32)
Chloride: 103 mmol/L (ref 101–111)
Creatinine, Ser: 0.89 mg/dL (ref 0.61–1.24)
Glucose, Bld: 102 mg/dL — ABNORMAL HIGH (ref 65–99)
POTASSIUM: 4 mmol/L (ref 3.5–5.1)
SODIUM: 140 mmol/L (ref 135–145)

## 2017-04-15 LAB — ETHANOL: Alcohol, Ethyl (B): <5 mg/dL (ref <5)

## 2017-04-15 MED ORDER — SODIUM CHLORIDE 0.9 % IV BOLUS (SEPSIS)
1000.0000 mL | Freq: Once | INTRAVENOUS | Status: AC
  Administered 2017-04-15: 1000 mL via INTRAVENOUS

## 2017-04-15 MED ORDER — LORAZEPAM 2 MG/ML IJ SOLN
INTRAMUSCULAR | Status: AC
  Administered 2017-04-15: 1 mg
  Filled 2017-04-15: qty 1

## 2017-04-15 NOTE — ED Notes (Signed)
Patient lying back in bed stating that he is dizzy.  This RN instructed patient that he can just lay back down in bed since he doesn't feel he can tolerate orthostatic VS.  As RN went to assist patient with moving legs back in bed, patient became still and shaking.  Patient's mother screaming for Doctor.   Dr Vincent PeyerMcManis at bedside instantly (since she just came out of patient room be side hallway bed).  Verbal Order for Ativan 1mg  via IV given per Dr Vincent PeyerMcManis.    When getting back to bedside from obtaining medication from pyxis, patient alert and oriented, very pale and diaphoretic.

## 2017-04-15 NOTE — ED Notes (Signed)
PT STS THAT HE WAS SLEEPING, AND WHEN HE WOKE UP, AS SOON AS HE AROSE, HE STARTED TO FEEL DIZZY, SWEATY, AND CONFUSED, THEN HE PASSED OUT. PT STS HIS PARENTS DID NOT BELIEVE HIM, AND REFUSED TO TAKE HIM TO THE HOSPITAL, SO HE CALLED 911 HIMSELF. HE STS THIS HAS HAPPENED A WEEK OR 2 AGO, AND HE WAS SEEN BY HIS PCP. HE ADMITS TO HAVING DEPRESSION AND ANXIETY, BUT STS THIS IS NOT FROM THAT. HE ALSO STS THAT HE HAS STOPPED TAKING SOME OF HIS MEDICATIONS DUE TO THESE EPISODES. HE DENIES SI/HI AT THIS TIME. HE STS HE DOES NOT WANT TO TALK TO HIS FAMILY AT THIS TIME, BECAUSE THEY DON'T BELIEVE ANYTHING IS WRONG WITH HIM, AND THEY ARE THREATENING TO KICK HIM OUT OF THE HOUSE FOR CALLING 911.

## 2017-04-15 NOTE — ED Notes (Signed)
ED Provider at bedside. 

## 2017-04-15 NOTE — ED Notes (Signed)
Pt is in hall bed. Not able to place on cardiac monitoring at the time

## 2017-04-15 NOTE — ED Notes (Signed)
Pt does not want to take orthostatic VS. Is scared to pass out. Yelling and screaming in the hall for help.

## 2017-04-15 NOTE — ED Triage Notes (Signed)
PT RECEIVED FROM HOME VIA EMS C/O "PASSING OUT WHILE ASLEEP". PER EMS, THE MOTHER STS HE HAS A HX OF ANXIETY AND DEPRESSION, BUT PT DENIES BOTH. MOTHER BELIEVES HE HAS A FORM OF AUTISM, BUT HAS NOT BEEN DIAGNOSED.

## 2017-04-15 NOTE — ED Notes (Signed)
Gave patient sprite  

## 2017-04-15 NOTE — ED Provider Notes (Signed)
[facility]-EMERGENCY DEPT Provider Note   CSN: [id number] Arrival date & time: 04/15/17  [id number]     History   Chief Complaint Chief Complaint  Patient presents with  . Loss of Consciousness    HPI Charles Copeland is a 21 y.o. male.  HPI Patient presents to the emergency department with syncope.  The patient states that he woke up at around 3:30 in the morning and states that he passed out.  He states that his parents did not believe him, so he waited for about 30 minutes and then called EMS.  The patient states that his parents are married that he called EMS.  His mother is in the lobby, but does not want her to come back to the treatment area.  The patient states that he has had this happen in the past.  The patient states that he thinks is make the condition better or worse.  He states that he feels fine at this pointThe patient denies chest pain, shortness of breath, headache,blurred vision, neck pain, fever, cough, weakness, numbness, dizziness, anorexia, edema, abdominal pain, nausea, vomiting, diarrhea, rash, back pain, dysuria, hematemesis, bloody stool.  Patient states that he is unable to tell me his allergies and that his mom would know.  Past Medical History:  Diagnosis Date  . Allergy   . Anxiety   . Asthma   . Depression   . MVP (mitral valve prolapse)     Patient Active Problem List   Diagnosis Date Noted  . Syncope 04/06/2017  . Preventative health care 05/13/2016  . Ringworm 05/13/2016  . 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 ANXIETY DISORDER 12/30/2009  . SCOLIOSIS 10/04/2007  . MDD (major depressive disorder) 07/23/2007  . ECZEMA 04/19/2007  . 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 nasal cautery  02/09   in OR  . NASAL SEPTUM SURGERY  7/13   [facility]--Dr Evans  . ORIF RADIAL FRACTURE  03/09   Left proximal   . TONSILLECTOMY AND ADENOIDECTOMY  2007       Home Medications    Prior to  Admission medications   Medication Sig Start Date End Date Taking? Authorizing Provider  cholecalciferol (VITAMIN D) 1000 UNITS tablet Take 1,000 Units by mouth daily.   Yes [provider]  escitalopram (LEXAPRO) 10 MG tablet Take 10 mg by mouth daily. Take along with the 20mg  tablet once daily   Yes [provider]  escitalopram (LEXAPRO) 20 MG tablet Take 1 tablet (20 mg total) by mouth daily. 12/16/13  Yes Karie SchwalbeLetvak, Richard I, MD  fexofenadine (ALLEGRA) 180 MG tablet Take 180 mg by mouth daily as needed.    Yes [provider]  ketoconazole (NIZORAL) 2 % cream Apply 1 application topically daily. 05/13/16  Yes Karie SchwalbeLetvak, Richard I, MD  Multiple Vitamin (MULTIVITAMIN WITH MINERALS) TABS Take 1 tablet by mouth daily.   Yes [provider]  VYVANSE 30 MG capsule Take 1 capsule by mouth daily. 12/28/16  Yes [provider]    Family History Family History  Problem Relation Age of Onset  . Asthma Mother   . GER disease Mother   . Eczema Mother   . Allergies Mother   . Alcohol abuse Maternal Grandfather        ? bipolar  . Cancer Paternal Grandfather        LIVER    Social History Social History  Substance Use Topics  . Smoking status:  Never Smoker  . Smokeless tobacco: Never Used  . Alcohol use No     Allergies   Remeron [mirtazapine]; Seroquel [quetiapine fumarate]; and Aripiprazole   Review of Systems Review of Systems All other systems negative except as documented in the HPI. All pertinent positives and negatives as reviewed in the HPI.  Physical Exam Updated Vital Signs BP 133/77   Pulse 62   Temp 98.1 F (36.7 C) (Oral)   Resp 14   Ht 6\' 1"  (1.854 m)   Wt 81.6 kg (180 lb)   SpO2 100%   BMI 23.75 kg/m   Physical Exam  Constitutional: He is oriented to person, place, and time. He appears well-developed and well-nourished. No distress.  HENT:  Head: Normocephalic and atraumatic.  Mouth/Throat: Oropharynx is clear and  moist.  Eyes: Pupils are equal, round, and reactive to light.  Neck: Normal range of motion. Neck supple.  Cardiovascular: Normal rate, regular rhythm and normal heart sounds.  Exam reveals no gallop and no friction rub.   No murmur heard. Pulmonary/Chest: Effort normal and breath sounds normal. No respiratory distress. He has no wheezes.  Abdominal: Soft. Bowel sounds are normal. He exhibits no distension. There is no tenderness.  Neurological: He is alert and oriented to person, place, and time. No sensory deficit. He exhibits normal muscle tone. Coordination normal.  Skin: Skin is warm and dry. Capillary refill takes less than 2 seconds. No rash noted. No erythema.  Psychiatric: He has a normal mood and affect. His behavior is normal.  Nursing note and vitals reviewed.    ED Treatments / Results  Labs (all labs ordered are listed, but only abnormal results are displayed) Labs Reviewed  BASIC METABOLIC PANEL - Abnormal; Notable for the following:       Result Value   Glucose, Bld 102 (*)    All other components within normal limits  RAPID URINE DRUG SCREEN, HOSP PERFORMED - Abnormal; Notable for the following:    Amphetamines POSITIVE (*)    All other components within normal limits  CBC  URINALYSIS, ROUTINE W REFLEX MICROSCOPIC  ETHANOL    EKG  EKG Interpretation  Date/Time:  Saturday April 15 2017 08:01:11 EDT Ventricular Rate:  54 PR Interval:  134 QRS Duration: 114 QT Interval:  426 QTC Calculation: 403 R Axis:   92 Text Interpretation:  Sinus bradycardia with sinus arrhythmia Rightward axis Incomplete left bundle branch block When compared with ECG of 12/27/2012 No significant change was found Confirmed by Samuel Jester [id number]) on 04/15/2017 8:05:20 AM       Radiology No results found.  Procedures Procedures (including critical care time)  Medications Ordered in ED Medications  sodium chloride 0.9 % bolus 1,000 mL (0 mLs Intravenous Stopped 04/15/17 0914)   LORazepam (ATIVAN) 2 MG/ML injection (1 mg  Given 04/15/17 6213)     Initial Impression / Assessment and Plan / ED Course  I have reviewed the triage vital signs and the nursing notes.  Pertinent labs & imaging results that were available during my care of the patient were reviewed by me and considered in my medical decision making (see chart for details).     The patient was attempting to get orthostatic vital signs and someone and allowed his mother to come back to the treatment area and when there attempting to sit him up.  He kept stating that he was going to pass out, started hyperventilating and then threw himself back in the bed and landed on a  very loud scream.  The patient's mother began to feel for help.  The nurses were with the patient did not turn time.  They did not notice any seizure-like activity.  The mother told the nursing staff that she wanted to speak to me because she knows what is going on once to transport him to [facility].  She wanted me to call his nurse psychiatrist as well have them meet him at the emergency room.  I did speak with the neuropsychiatrist on Monday, who sees the patient and she believes that the patient has severe psychogenic components to most of these issues.  He has had similar issues in the past.  She also feels that family contributes to these issues.  When I was discussing everything with the patient and his mother.  The patient seemed very elated that his mother finally believed him.  The patient continued to tell his mother that he trusts her. patient was discharged AMA.  Mother will take the patient by private vehicle to [facility] Final diagnoses:  None    New Prescriptions New Prescriptions   No medications on file     Charlestine Night, Cordelia Poche 04/17/17 1615    Samuel Jester, DO 04/19/17 1531

## 2017-04-19 ENCOUNTER — Ambulatory Visit (INDEPENDENT_AMBULATORY_CARE_PROVIDER_SITE_OTHER): Payer: 59 | Admitting: Internal Medicine

## 2017-04-19 ENCOUNTER — Encounter: Payer: Self-pay | Admitting: Internal Medicine

## 2017-04-19 VITALS — BP 110/68 | HR 89 | Temp 98.0°F | Wt 173.0 lb

## 2017-04-19 DIAGNOSIS — F322 Major depressive disorder, single episode, severe without psychotic features: Secondary | ICD-10-CM

## 2017-04-19 DIAGNOSIS — R55 Syncope and collapse: Secondary | ICD-10-CM | POA: Diagnosis not present

## 2017-04-19 NOTE — Assessment & Plan Note (Signed)
Recurrent Doubt PAT---had spells in ER and no arrhythmia Most likely neurogenic---[facility] plans tilt table test I think okay to drive Doesn't sound like seizures

## 2017-04-19 NOTE — Assessment & Plan Note (Signed)
Asked them to review meds with Dr Shane CrutchGualtieri to be sure that some of this is not related to his meds (withdrawl of vyvanse or the lexapro)

## 2017-04-19 NOTE — Progress Notes (Signed)
Subjective:    Patient ID: Charles Copeland, male    DOB: December 01, 1995, 21 y.o.   MRN: [id number]  HPI Here with mom to follow up of ER visits Did try the risperidone--didn't like he felt on it so didn't stay on it  Has seen the NP with Dr Shane Crutch They tried a reduced dose of the risperidone--and he didn't tolerate that either  Starting on line classes at [employer]--3 this semester, then 1 in spring Then will have AD in programming Applied for job at [employer] and interviewed Told to go through [employer] they are doing lately Told he has job but awaiting the paperwork, etc  Very excited about this--to have a real job Realizes he will be starting with simple computer work but hopes to move up after degree, etc  Did have temp job at [employer] after 1 day (wasn't what he expected and was covered with aluminum dust)  Reviewed ER records about the fainting spells Always at 3AM Will awaken with racing heart---like he woke from a bad dream If he stands then, he can pass out Almost 2 weeks  No palpitations during the day Was orthostatic in the ER at [facility] so got fluids No documented tachycardia but had color change, looked to left, then postured with teeth grinding and yelled out Similar spell at [facility]  Current Outpatient Prescriptions on File Prior to Visit  Medication Sig Dispense Refill  . cholecalciferol (VITAMIN D) 1000 UNITS tablet Take 1,000 Units by mouth daily.    Marland Kitchen escitalopram (LEXAPRO) 10 MG tablet Take 10 mg by mouth daily. Take along with the 20mg  tablet once daily    . escitalopram (LEXAPRO) 20 MG tablet Take 1 tablet (20 mg total) by mouth daily. 90 tablet 3  . fexofenadine (ALLEGRA) 180 MG tablet Take 180 mg by mouth daily as needed.     Marland Kitchen ketoconazole (NIZORAL) 2 % cream Apply 1 application topically daily. 60 g 1  . Multiple Vitamin (MULTIVITAMIN WITH MINERALS) TABS Take 1 tablet by mouth daily.    Marland Kitchen VYVANSE 30 MG capsule Take 1 capsule by mouth  daily.     No current facility-administered medications on file prior to visit.     Allergies  Allergen Reactions  . Remeron [Mirtazapine] Other (See Comments)    Hypotension, passed out  . Seroquel [Quetiapine Fumarate] Swelling    Excessive appetite   . Aripiprazole Other (See Comments)    tics    Past Medical History:  Diagnosis Date  . Allergy   . Anxiety   . Asthma   . Depression   . MVP (mitral valve prolapse)     Past Surgical History:  Procedure Laterality Date  . nasal cautery  02/09   in OR  . NASAL SEPTUM SURGERY  7/13   [facility]--Dr Evans  . ORIF RADIAL FRACTURE  03/09   Left proximal   . TONSILLECTOMY AND ADENOIDECTOMY  2007    Family History  Problem Relation Age of Onset  . Asthma Mother   . GER disease Mother   . Eczema Mother   . Allergies Mother   . Alcohol abuse Maternal Grandfather        ? bipolar  . Cancer Paternal Grandfather        LIVER    Social History   Social History  . Marital status: Single    Spouse name: N/A  . Number of children: N/A  . Years of education: N/A   Occupational History  .  Student --[employer]    Social History Main Topics  . Smoking status: Never Smoker  . Smokeless tobacco: Never Used  . Alcohol use No  . Drug use: No  . Sexual activity: Not on file   Other Topics Concern  . Not on file   Social History Narrative   Mother: retired Optometristediatrician   Father: Lab director-[employer]   One brother   No smokers in house   Review of Systems Eating okay Weight is down some (since off the abilfy)    Objective:   Physical Exam  Constitutional: No distress.  Neck: No thyromegaly present.  Cardiovascular: Normal rate, regular rhythm, normal heart sounds and intact distal pulses.  Exam reveals no gallop.   No murmur heard. Pulmonary/Chest: Effort normal and breath sounds normal. No respiratory distress. He has no wheezes. He has no rales.  Musculoskeletal: He exhibits no edema.  Lymphadenopathy:    He has  no cervical adenopathy.  Psychiatric:  He and mom at odds as usual--interrupting each other, etc          Assessment & Plan:

## 2017-04-21 ENCOUNTER — Encounter: Payer: Self-pay | Admitting: *Deleted

## 2017-04-21 ENCOUNTER — Emergency Department
Admission: EM | Admit: 2017-04-21 | Discharge: 2017-04-21 | Disposition: A | Discharge status: Home / Self Care | Payer: 59 | Attending: Emergency Medicine | Admitting: Emergency Medicine

## 2017-04-21 DIAGNOSIS — R42 Dizziness and giddiness: Secondary | ICD-10-CM | POA: Insufficient documentation

## 2017-04-21 DIAGNOSIS — Z5321 Procedure and treatment not carried out due to patient leaving prior to being seen by health care provider: Secondary | ICD-10-CM | POA: Insufficient documentation

## 2017-04-21 NOTE — ED Triage Notes (Signed)
Pt states he was in a cab and got too hot and felt as if he was going to have a seizure, states the past few days he has been having seizure like activity, states he has been seen at [facility], awake and alert upon arrival, states "I can stop it from happening, I just have to rest", states he was trying to report his mothers neglect on his care, he states "she does not help me", pt awake and alert in no acute distress

## 2017-04-21 NOTE — ED Notes (Signed)
AAOx3.  Ambulates with easy ands steady gait. NAD.  States "I'm feeling fine now".

## 2017-04-21 NOTE — ED Triage Notes (Signed)
First Nurse Note:  Arrives via [facility] for c/o dizziness.  Patient is AAOx3.  Skin warm and dry. MAE equally and strong.  NAD.

## 2017-04-21 NOTE — ED Notes (Signed)
Pt refused blood draw unless he could lie down, offered pt recliner for blood draw, pt refused states "If I dont lie down I will have a seizure", pt refused blood in triage until he can lie down, pt put back in lobby, first nurse aware

## 2017-04-27 ENCOUNTER — Telehealth: Payer: Self-pay | Admitting: Internal Medicine

## 2017-04-27 NOTE — Telephone Encounter (Signed)
This has been placed in Letvak's in box

## 2017-04-27 NOTE — Telephone Encounter (Signed)
Caller Name:Izaan Haupt Relationship to Patient:self Best number:[phone] Pharmacy:  Reason for call: pt dropped off forms that need completed. Please call when ready for pickup Placing in rx tower

## 2017-04-28 ENCOUNTER — Telehealth: Payer: Self-pay

## 2017-04-28 NOTE — Telephone Encounter (Signed)
Patient's mother notified forms are ready for pick up and no charge.

## 2017-04-28 NOTE — Telephone Encounter (Signed)
Pt had question on form filled out; question 1 pt wants to know if marking indicated does that meanpt needs more vaccination; shannon said that means the immunization is indicated and the explanation below the question means pt had appropriate vaccine as an infant. Pt will come back and pick up form. Form at front desk for pick up.

## 2017-05-05 ENCOUNTER — Encounter: Payer: Self-pay | Admitting: Family Medicine

## 2017-05-05 ENCOUNTER — Ambulatory Visit (INDEPENDENT_AMBULATORY_CARE_PROVIDER_SITE_OTHER): Payer: 59 | Admitting: Family Medicine

## 2017-05-05 VITALS — BP 117/90 | HR 87 | Temp 98.3°F | Ht 73.0 in | Wt 171.0 lb

## 2017-05-05 DIAGNOSIS — R42 Dizziness and giddiness: Secondary | ICD-10-CM

## 2017-05-05 MED ORDER — METHYLPREDNISOLONE 4 MG PO TBPK: ORAL_TABLET | ORAL | 0 refills | Status: DC

## 2017-05-05 MED ORDER — MECLIZINE HCL 25 MG PO TABS: 25.0000 mg | ORAL_TABLET | ORAL | 0 refills | Status: DC | PRN

## 2017-05-05 NOTE — Patient Instructions (Signed)
WE NOW OFFER   [facility] FAST TRACK!!!  SAME DAY Appointments for ACUTE CARE  Such as: Sprains, Injuries, cuts, abrasions, rashes, muscle pain, joint pain, back pain Colds, flu, sore throats, headache, allergies, cough, fever  Ear pain, sinus and eye infections Abdominal pain, nausea, vomiting, diarrhea, upset stomach Animal/insect bites  3 Easy Ways to Schedule: Walk-In Scheduling Call in scheduling Mychart Sign-up: [email or web address]         

## 2017-05-05 NOTE — Progress Notes (Signed)
   Subjective:    Patient ID: Charles Copeland, male    DOB: 08/28/1996, 21 y.o.   MRN: [id number]  HPI Here to discuss what he thinks is an inner ear problem. He normally sees Dr. Alphonsus SiasLetvak. About one month ago he had the sudden onset of pain in the right ear which lasted several days and then went away. Shortly after the pain stopped he developed a high pitched ringing in the ear and he started to have dizzy spells. These spells sometimes made him feel like he would pass out, other times they felt the room was spinning around him. These would often wake him up from sleep in the middle of the night, or else they would start if he got up out of bed in the night to use the bathroom. Some spells were mild and some were so severe he would have to lie down. He often gets nauseated but has not vomited. No headaches and no hearing loss. He saw several ER's and several urgent cares and had a workup with labs and a [facility], but no etiology was found. His mother, who is a pediatrician, thinks he has an inner ear issue and had him come in today for help. As we speak he feels fine.    Review of Systems  Constitutional: Negative.   HENT: Positive for ear pain and tinnitus. Negative for congestion, ear discharge, facial swelling, hearing loss, postnasal drip, sinus pain and sinus pressure.   Eyes: Negative.   Respiratory: Negative.   Cardiovascular: Negative.   Neurological: Positive for dizziness and light-headedness. Negative for tremors, seizures, syncope, facial asymmetry, speech difficulty, numbness and headaches.       Objective:   Physical Exam  Constitutional: He is oriented to person, place, and time. He appears well-developed and well-nourished. No distress.  HENT:  Head: Normocephalic and atraumatic.  Right Ear: External ear normal.  Left Ear: External ear normal.  Nose: Nose normal.  Mouth/Throat: Oropharynx is clear and moist.  Eyes: Pupils are equal, round, and reactive to  light. Conjunctivae and EOM are normal.  Neck: Neck supple. No thyromegaly present.  Cardiovascular: Normal rate, regular rhythm, normal heart sounds and intact distal pulses.   Pulmonary/Chest: Effort normal and breath sounds normal. No respiratory distress. He has no wheezes. He has no rales.  Abdominal: Soft. Bowel sounds are normal. He exhibits no distension and no mass. There is no tenderness. There is no rebound and no guarding.  Lymphadenopathy:    He has no cervical adenopathy.  Neurological: He is alert and oriented to person, place, and time. He has normal reflexes. No cranial nerve deficit. He exhibits normal muscle tone. Coordination normal.  Psychiatric: His behavior is normal. Thought content normal.  Anxious           Assessment & Plan:  He has had spells of dizziness and tinnitus, which started off with unilateral ear pain. This does sound like an inner ear infection that has caused some vertigo. We will treat with a Medrol dose pack and add Meclizine prn. He can follow up with Dr. Alphonsus SiasLetvak prn. Gershon CraneStephen Jahnessa Vanduyn, MD

## 2017-05-21 ENCOUNTER — Encounter (HOSPITAL_COMMUNITY): Payer: Self-pay | Admitting: Emergency Medicine

## 2017-05-21 ENCOUNTER — Ambulatory Visit (HOSPITAL_COMMUNITY)
Admission: EM | Admit: 2017-05-21 | Discharge: 2017-05-21 | Disposition: A | Discharge status: Other Acute Inpatient Hospital | Payer: 59

## 2017-05-21 ENCOUNTER — Emergency Department (HOSPITAL_COMMUNITY)
Admission: EM | Admit: 2017-05-21 | Discharge: 2017-05-21 | Disposition: A | Discharge status: Home / Self Care | Payer: 59 | Attending: Emergency Medicine | Admitting: Emergency Medicine

## 2017-05-21 DIAGNOSIS — Z79899 Other long term (current) drug therapy: Secondary | ICD-10-CM | POA: Diagnosis not present

## 2017-05-21 DIAGNOSIS — J45909 Unspecified asthma, uncomplicated: Secondary | ICD-10-CM | POA: Insufficient documentation

## 2017-05-21 DIAGNOSIS — R479 Unspecified speech disturbances: Secondary | ICD-10-CM

## 2017-05-21 LAB — CBC
HCT: 42.8 % (ref 39.0–52.0)
HEMOGLOBIN: 14.8 g/dL (ref 13.0–17.0)
MCH: 30.8 pg (ref 26.0–34.0)
MCHC: 34.6 g/dL (ref 30.0–36.0)
MCV: 89.2 fL (ref 78.0–100.0)
Platelets: 235 10*3/uL (ref 150–400)
RBC: 4.8 MIL/uL (ref 4.22–5.81)
RDW: 13 % (ref 11.5–15.5)
WBC: 7.4 10*3/uL (ref 4.0–10.5)

## 2017-05-21 LAB — BASIC METABOLIC PANEL
ANION GAP: 9 (ref 5–15)
BUN: 18 mg/dL (ref 6–20)
CALCIUM: 9.7 mg/dL (ref 8.9–10.3)
CO2: 25 mmol/L (ref 22–32)
Chloride: 104 mmol/L (ref 101–111)
Creatinine, Ser: 0.82 mg/dL (ref 0.61–1.24)
GFR calc Af Amer: >60 mL/min (ref >60)
GLUCOSE: 91 mg/dL (ref 65–99)
Potassium: 3.3 mmol/L — ABNORMAL LOW (ref 3.5–5.1)
Sodium: 138 mmol/L (ref 135–145)

## 2017-05-21 LAB — URINALYSIS, ROUTINE W REFLEX MICROSCOPIC
BILIRUBIN URINE: NEGATIVE
Glucose, UA: NEGATIVE mg/dL
Hgb urine dipstick: NEGATIVE
KETONES UR: 20 mg/dL — AB
Leukocytes, UA: NEGATIVE
NITRITE: NEGATIVE
Protein, ur: NEGATIVE mg/dL
SPECIFIC GRAVITY, URINE: 1.02 (ref 1.005–1.030)
pH: 5 (ref 5.0–8.0)

## 2017-05-21 LAB — CBG MONITORING, ED: GLUCOSE-CAPILLARY: 74 mg/dL (ref 65–99)

## 2017-05-21 MED ORDER — LORAZEPAM 1 MG PO TABS
1.0000 mg | ORAL_TABLET | Freq: Once | ORAL | Status: AC
  Administered 2017-05-21: 1 mg via ORAL
  Filled 2017-05-21: qty 1

## 2017-05-21 MED ORDER — LORAZEPAM 1 MG PO TABS: 1.0000 mg | ORAL_TABLET | Freq: Three times a day (TID) | ORAL | 0 refills | Status: DC | PRN

## 2017-05-21 NOTE — ED Triage Notes (Signed)
Pt seen here for same several times before. Pt is having severe panic attacks where he thinks he is dying. Pt becomes "dizzy and starts having slurred speech" Pt has been seen here, [facility], [facility], [facility]. Pt has severe anxiety and is on several psych meds.

## 2017-05-21 NOTE — ED Notes (Signed)
Pt ambulated to BR with even, steady gait.  

## 2017-05-21 NOTE — ED Notes (Signed)
Pt knocking on triage door stating he does not feel good and is dizzy, instructed pt to sit down

## 2017-05-21 NOTE — Discharge Instructions (Signed)
You were seen in the emergency department after having difficulty putting words together. Your blood work and EKG were normal. After discussion of your symptoms and recent evaluation for increased anxiety, passing out episodes, I suspect anxiety and stress may be contributing to your symptoms today. Please call your neuro psychiatrist as soon as possible to reschedule your appointment and try to be evaluated sooner. Take Ativan as needed for increased anxiety.  Ativan is a controlled substance that has risk of overdose, death, dependence and abuse. Do not consume alcohol, drive or use heavy machinery while taking this medication. Do not leave unattended around children. The emergency department has a strict policy regarding prescription of controlled substances medications. We are unable to refill this medication multiple times in the emergency department as it can have long term consequences and side effects. Contact your primary care provider or specialist for refills.

## 2017-05-21 NOTE — ED Provider Notes (Signed)
[facility]-EMERGENCY DEPT Provider Note   CSN: [id number] Arrival date & time: 05/21/17  1747     History   Chief Complaint Chief Complaint  Patient presents with  . Dizziness    HPI Charles Copeland is a 21 y.o. male with history of head injury in 6th grade, depression, anxiety presents to the ED with mother for evaluation of difficulty putting words together at 5 PM. Associated with nausea. Patient states he was talking to somebody on the computer and was having a hard time "putting words together", he thought he was having a stroke so he went downstairs and told his mom he needed to go to the emergency room. Mother states that patient came downstairs and looked very anxious, he was talking really fast and was pacing back and forth, no slurred speech or facial drooping noted. Patient is currently on Lexapro, Geodon and Vyvanse for anxiety.  Patient's mom is a retired Optometrist, assists with some of history. She states patient has been struggling with worsening anxiety for the last couple of months. Some of his symptoms have included LOC, feeling of doom, clenching teeth, vision changes, , tremors, posturing with rigidity, head tilting.   He has a Chief Operating Officer. Has been seen in different EDs multiple times in the last 2 months.  Has had a normal head MRI and EEG in the last month. 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  He was given prednisone (which he did not take) and meclazine (which he has not "needed"). Vertiginous and dizziness have resolved.   No fevers, visual changes, headache, slurred speech, facial drooping, dizziness, difficulty swallowing, CP, SOB, abdominal pain, numbness or weakness distally. Mother states pt's anxiety is usually worse around dinner time. He recently started a new job and has been under more stress, recently started vyvanse to help him focus during work. Mother thinks this  may be worsening anxiety.   HPI  Past Medical History:  Diagnosis Date  . Allergy   . Anxiety   . Asthma   . Depression   . MVP (mitral valve prolapse)     Patient Active Problem List   Diagnosis Date Noted  . Syncope 04/06/2017  . Preventative health care 05/13/2016  . Ringworm 05/13/2016  . 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 ANXIETY DISORDER 12/30/2009  . SCOLIOSIS 10/04/2007  . MDD (major depressive disorder) 07/23/2007  . ECZEMA 04/19/2007  . 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 nasal cautery  02/09   in OR  . NASAL SEPTUM SURGERY  7/13   [facility]--Dr Evans  . ORIF RADIAL FRACTURE  03/09   Left proximal   . TONSILLECTOMY AND ADENOIDECTOMY  2007       Home Medications    Prior to Admission medications   Medication Sig Start Date End Date Taking? Authorizing Provider  cholecalciferol (VITAMIN D) 1000 UNITS tablet Take 1,000 Units by mouth daily.   Yes [provider]  escitalopram (LEXAPRO) 20 MG tablet Take 1 tablet (20 mg total) by mouth daily. Patient taking differently: Take 1 tablet (30) mg total) by mouth daily. 12/16/13  Yes [name], MD  meclizine (ANTIVERT) 25 MG tablet Take 1 tablet (25 mg total) by mouth every 4 (four) hours as needed for dizziness. 05/05/17  Yes Nelwyn Salisbury, MD  Multiple Vitamin (MULTIVITAMIN WITH MINERALS) TABS Take 1 tablet by mouth daily.  Yes [provider]  VYVANSE 30 MG capsule Take 30 capsules by mouth daily.  12/28/16  Yes [provider]  ziprasidone (GEODON) 40 MG capsule Take 40 mg by mouth at bedtime.  04/24/17  Yes [provider]  LORazepam (ATIVAN) 1 MG tablet Take 1 tablet (1 mg total) by mouth 3 (three) times daily as needed for anxiety. 05/21/17   Liberty Handy, PA-C  methylPREDNISolone (MEDROL DOSEPAK) 4 MG TBPK tablet As directed Patient not taking: Reported on 05/21/2017 05/05/17   Nelwyn Salisbury,  MD    Family History Family History  Problem Relation Age of Onset  . Asthma Mother   . GER disease Mother   . Eczema Mother   . Allergies Mother   . Alcohol abuse Maternal Grandfather        ? bipolar  . Cancer Paternal Grandfather        LIVER    Social History Social History  Substance Use Topics  . Smoking status: Never Smoker  . Smokeless tobacco: Never Used  . Alcohol use No     Allergies   Remeron [mirtazapine]; Seroquel [quetiapine fumarate]; and Aripiprazole   Review of Systems Review of Systems  Eyes: Negative for visual disturbance.  Gastrointestinal: Positive for nausea. Negative for vomiting.  Neurological: Positive for speech difficulty. Negative for dizziness, tremors, syncope, weakness, numbness and headaches.  Psychiatric/Behavioral: The patient is nervous/anxious.      Physical Exam Updated Vital Signs BP 138/89   Pulse 88   Temp 99 F (37.2 C) (Oral)   Resp 20   Ht  (1.854 m)   Wt 77.1 kg (170 lb)   SpO2 100%   BMI 22.43 kg/m   Physical Exam  Constitutional: He is oriented to person, place, and time. He appears well-developed and well-nourished. No distress.  NAD.  HENT:  Head: Normocephalic and atraumatic.  Right Ear: External ear normal.  Left Ear: External ear normal.  Nose: Nose normal.  Eyes: Conjunctivae and EOM are normal. No scleral icterus.  Neck: Normal range of motion. Neck supple.  Cardiovascular: Normal rate, regular rhythm, normal heart sounds and intact distal pulses.   No murmur heard. Pulmonary/Chest: Effort normal and breath sounds normal. He has no wheezes.  Musculoskeletal: Normal range of motion. He exhibits no deformity.  Neurological: He is alert and oriented to person, place, and time.  A&O to self, place and time. Speech and phonation normal.   Thought process coherent.   Strength 5/5 in upper and lower extremities.   Sensation to light touch intact in upper and lower extremities.  Gait normal.    Negative Romberg. Intact finger to nose test. CN I not tested CN II full visual fields  CN III, IV, VI PEERL and EOMs intact bilaterally CN V light touch intact in all 3 divisions of trigeminal nerve CN VII facial nerve movements intact, symmetric, bilaterally CN VIII hearing intact to finger rub, bilaterally CN IX, X no uvula deviation, symmetric soft palate rise CN XI 5/5 SCM and trapezius strength bilaterally  CN XII Tongue midline with symmetric L/R movement  Skin: Skin is warm and dry. Capillary refill takes less than 2 seconds.  Psychiatric: His speech is normal and behavior is normal. Judgment and thought content normal. His mood appears anxious.  Nursing note and vitals reviewed.    ED Treatments / Results  Labs (all labs ordered are listed, but only abnormal results are displayed) Labs Reviewed  BASIC METABOLIC PANEL - Abnormal; Notable  for the following:       Result Value   Potassium 3.3 (*)    All other components within normal limits  URINALYSIS, ROUTINE W REFLEX MICROSCOPIC - Abnormal; Notable for the following:    Ketones, ur 20 (*)    All other components within normal limits  CBC  CBG MONITORING, ED    EKG  EKG Interpretation  Date/Time:  Sunday May 21 2017 17:56:06 EDT Ventricular Rate:  88 PR Interval:  132 QRS Duration: 102 QT Interval:  356 QTC Calculation: 430 R Axis:   90 Text Interpretation:  Normal sinus rhythm with sinus arrhythmia Rightward axis Borderline ECG Prominant t wave--noted 04/15/2017 Confirmed by Rolland Porter ([id number]) on 05/21/2017 6:07:29 PM       Radiology No results found.  Procedures Procedures (including critical care time)  Medications Ordered in ED Medications  LORazepam (ATIVAN) tablet 1 mg (not administered)     Initial Impression / Assessment and Plan / ED Course  I have reviewed the triage vital signs and the nursing notes.  Pertinent labs & imaging results that were available during my care of the patient  were reviewed by me and considered in my medical decision making (see chart for details).      History provided mostly by patient's mother at bedside who is a retired Optometrist.  21 year old male with history of anxiety and depression diagnosed after significant head trauma in 2008 presents to the ED for evaluation of resolved, "difficulty putting words together" that lasted a couple of minutes at around 5 PM today. Mother states patient came down the stairs and told him he thought he was having a stroke, he looked very anxious and was pacing back and forth. She denies noticing slurred speech, facial drooping, difficulty walking. Patient states that his symptoms had resolved by the time he went downstairs. Patient recently started working and neuropsychiatrist started Vyvanse to help with concentration, mother suspects this may be affecting his anxiety. Mother states his anxiety always worsens at around supper time, before he has his Geodon.  On exam, patient appears anxious but is cooperative. States he feels a lot better. No diplopia, dysmetria, dysarthria. Cranial nerves intact. Neuro exam normal. Triage ordered a basic screening labs and EKG, all within normal limits.  Chart review reveals patient has had frequent evaluations had ED for syncope, dizziness, vision changes, tremors, posturing with head tilting over the last couple of months.  Recently evaluated by ped neurologist at [facility] ED on 04/15/17 for syncope and collapse who documented  Symptoms inconsistent with seizure activity or cardiac etiology. The patient was discharged without antiseizure medication. Based on exam, history provided by patient and mother I think increased stress recently with new job may be contributing to patient's symptoms today. He is very low risk for stroke/TIA. Mother states pt has had MRI and EEG within the last month, normal. Discussed planned to discharge patient with follow-up with neuropsychiatry and Ativan when  necessary, mother is agreeable with this.   Final Clinical Impressions(s) / ED Diagnoses   Final diagnoses:  Difficulty with speech    New Prescriptions New Prescriptions   LORAZEPAM (ATIVAN) 1 MG TABLET    Take 1 tablet (1 mg total) by mouth 3 (three) times daily as needed for anxiety.     Liberty Handy, PA-C 05/21/17 2239    Doug Sou, MD 05/22/17 [phone]

## 2017-05-21 NOTE — ED Notes (Signed)
Pt c/o nausea and anxiety.  PA aware.

## 2017-05-21 NOTE — ED Notes (Addendum)
Pt had an episode of slurred speech while talking online; told parents to call 911.  Mother brought patient here.  Pt has long hx of anxiety.  Recently dx with Meniere's disease.  Pt on Geodon.  Pt recently started a new job, had to have Vyvanse added to his meds.  Mom thinks Geodon needs to be taken earlier.

## 2017-05-21 NOTE — ED Notes (Signed)
Pt    Assessed    By   Azucena Fallen    Pa      Advised     That      Pt  Should  Be      Seen in  [facility]   Mother   Agreed  And  Will  Take  The  Patient

## 2017-05-21 NOTE — ED Notes (Signed)
Pt up to triage door again stating he needs his anxiety medicine and he wants to know how much longer. Informed pt he needs to go to [facility] to inquire about wait time

## 2017-06-12 ENCOUNTER — Telehealth: Payer: Self-pay | Admitting: *Deleted

## 2017-06-12 NOTE — Telephone Encounter (Signed)
Form in Large envelope in Dr Karle Starch Inbox on his desk.

## 2017-06-12 NOTE — Telephone Encounter (Signed)
Patient mother dropped off a form that needs to be lookeD at by Dr. Alphonsus Sias. A letter needs to be written for the patient. This will be in the RX tower. Thank you

## 2017-06-13 ENCOUNTER — Encounter: Payer: Self-pay | Admitting: Internal Medicine

## 2017-06-13 DIAGNOSIS — Z7689 Persons encountering health services in other specified circumstances: Secondary | ICD-10-CM

## 2017-06-13 NOTE — Telephone Encounter (Signed)
Letter done $20 charge 

## 2017-06-13 NOTE — Telephone Encounter (Signed)
Patient's mother notified form is ready and of $20 charge.

## 2017-09-01 ENCOUNTER — Telehealth: Payer: Self-pay | Admitting: Internal Medicine

## 2017-09-01 DIAGNOSIS — F322 Major depressive disorder, single episode, severe without psychotic features: Secondary | ICD-10-CM

## 2017-09-01 NOTE — Telephone Encounter (Signed)
I spoke with Charles Copeland and pts psychiatrist suggested getting thyroid function panel,CBC and CMP. Charles Copeland request cb to schedule lab appt if Dr Alphonsus SiasLetvak will order test.

## 2017-09-01 NOTE — Telephone Encounter (Signed)
Copied from CRM #[id number]. >> Sep 01, 2017  1:23 PM Raquel SarnaHayes, Teresa G wrote: Pt is requesting blood work that needs to be done asap BEFORE he starts back to school.  He would like a call back to let him know if it can be done and to get scheduled.

## 2017-09-01 NOTE — Telephone Encounter (Signed)
Spoke to pt. He wanted an order for [facility]. [facility] and [facility] up front to pickup

## 2017-09-01 NOTE — Telephone Encounter (Signed)
Copied from CRM #[id number]. Topic: Inquiry °>> Sep 01, 2017  1:23 PM Hayes, Teresa G wrote: °Pt is requesting blood work that needs to be done asap BEFORE he starts back to school.  He would like a call back to let him know if it can be done and to get scheduled. °

## 2017-09-01 NOTE — Telephone Encounter (Signed)
Copied from CRM [phone]. Topic: Inquiry >> Sep 01, 2017  1:23 PM Raquel SarnaHayes, Teresa G wrote: Pt is requesting blood work that needs to be done asap BEFORE he starts back to school.  He would like a call back to let him know if it can be done and to get scheduled.

## 2017-09-01 NOTE — Telephone Encounter (Signed)
Labs ordered. Set up time for the draw

## 2017-09-16 ENCOUNTER — Emergency Department (HOSPITAL_COMMUNITY): Payer: Managed Care, Other (non HMO)

## 2017-09-16 ENCOUNTER — Emergency Department (HOSPITAL_COMMUNITY)
Admission: EM | Admit: 2017-09-16 | Discharge: 2017-09-16 | Disposition: A | Discharge status: Home / Self Care | Payer: Managed Care, Other (non HMO) | Attending: Emergency Medicine | Admitting: Emergency Medicine

## 2017-09-16 DIAGNOSIS — F329 Major depressive disorder, single episode, unspecified: Secondary | ICD-10-CM | POA: Diagnosis not present

## 2017-09-16 DIAGNOSIS — J45909 Unspecified asthma, uncomplicated: Secondary | ICD-10-CM | POA: Insufficient documentation

## 2017-09-16 DIAGNOSIS — R21 Rash and other nonspecific skin eruption: Secondary | ICD-10-CM | POA: Insufficient documentation

## 2017-09-16 DIAGNOSIS — R0602 Shortness of breath: Secondary | ICD-10-CM | POA: Diagnosis not present

## 2017-09-16 DIAGNOSIS — I951 Orthostatic hypotension: Secondary | ICD-10-CM

## 2017-09-16 DIAGNOSIS — Z79899 Other long term (current) drug therapy: Secondary | ICD-10-CM | POA: Diagnosis not present

## 2017-09-16 DIAGNOSIS — R55 Syncope and collapse: Secondary | ICD-10-CM | POA: Diagnosis present

## 2017-09-16 DIAGNOSIS — F419 Anxiety disorder, unspecified: Secondary | ICD-10-CM | POA: Diagnosis not present

## 2017-09-16 LAB — COMPREHENSIVE METABOLIC PANEL
ALBUMIN: 4.6 g/dL (ref 3.5–5.0)
ALT: 15 U/L — AB (ref 17–63)
AST: 25 U/L (ref 15–41)
Alkaline Phosphatase: 40 U/L (ref 38–126)
Anion gap: 9 (ref 5–15)
BILIRUBIN TOTAL: 0.8 mg/dL (ref 0.3–1.2)
BUN: 18 mg/dL (ref 6–20)
CHLORIDE: 105 mmol/L (ref 101–111)
CO2: 23 mmol/L (ref 22–32)
CREATININE: 0.78 mg/dL (ref 0.61–1.24)
Calcium: 9.5 mg/dL (ref 8.9–10.3)
GFR calc Af Amer: >60 mL/min (ref >60)
Glucose, Bld: 98 mg/dL (ref 65–99)
POTASSIUM: 4 mmol/L (ref 3.5–5.1)
Sodium: 137 mmol/L (ref 135–145)
Total Protein: 7.3 g/dL (ref 6.5–8.1)

## 2017-09-16 LAB — RAPID URINE DRUG SCREEN, HOSP PERFORMED
Amphetamines: NOT DETECTED
BARBITURATES: NOT DETECTED
BENZODIAZEPINES: POSITIVE — AB
Cocaine: NOT DETECTED
Opiates: NOT DETECTED
Tetrahydrocannabinol: NOT DETECTED

## 2017-09-16 LAB — CBC
HEMATOCRIT: 41.5 % (ref 39.0–52.0)
Hemoglobin: 14.7 g/dL (ref 13.0–17.0)
MCH: 32.4 pg (ref 26.0–34.0)
MCHC: 35.4 g/dL (ref 30.0–36.0)
MCV: 91.4 fL (ref 78.0–100.0)
PLATELETS: 209 10*3/uL (ref 150–400)
RBC: 4.54 MIL/uL (ref 4.22–5.81)
RDW: 12.8 % (ref 11.5–15.5)
WBC: 7.6 10*3/uL (ref 4.0–10.5)

## 2017-09-16 LAB — URINALYSIS, ROUTINE W REFLEX MICROSCOPIC
BILIRUBIN URINE: NEGATIVE
GLUCOSE, UA: NEGATIVE mg/dL
Hgb urine dipstick: NEGATIVE
KETONES UR: NEGATIVE mg/dL
Leukocytes, UA: NEGATIVE
Nitrite: NEGATIVE
PH: 6 (ref 5.0–8.0)
PROTEIN: NEGATIVE mg/dL
Specific Gravity, Urine: 1.026 (ref 1.005–1.030)

## 2017-09-16 LAB — I-STAT TROPONIN, ED: TROPONIN I, POC: 0 ng/mL (ref 0.00–0.08)

## 2017-09-16 LAB — LIPASE, BLOOD: LIPASE: 27 U/L (ref 11–51)

## 2017-09-16 MED ORDER — SODIUM CHLORIDE 0.9 % IV BOLUS (SEPSIS): 2000.0000 mL | Freq: Once | INTRAVENOUS | Status: DC

## 2017-09-16 MED ORDER — SODIUM CHLORIDE 0.9 % IV BOLUS (SEPSIS)
2000.0000 mL | Freq: Once | INTRAVENOUS | Status: AC
  Administered 2017-09-16: 2000 mL via INTRAVENOUS

## 2017-09-16 NOTE — ED Notes (Signed)
Pt went to X-ray.   

## 2017-09-16 NOTE — ED Notes (Signed)
Pt has multiple complaints-- started with eating potato chips yesterday, developed a rash -- took Benadryl 50mg  per mom, since then pt has had feeling of "I am going to pass out, not sleeping, woke up at 0130, having bowel movements x 7 today, not diarrhea-- formed. I have a hx of constipation"  Mother states has an appt with psychiatrist Jan 24th, has been having meds adjusted and changed.

## 2017-09-16 NOTE — Discharge Instructions (Signed)
You need an outpatient echocardiogram and holter monitoring. Get help right away if: You have a severe headache. You have unusual pain in your chest, abdomen, or back. You are bleeding from your mouth or rectum, or you have black or tarry stool. You have a very fast or irregular heartbeat (palpitations). You have pain with breathing. You faint once or repeatedly. You have a seizure. You are confused. You have trouble walking. You have severe weakness. You have vision problems.

## 2017-09-16 NOTE — ED Triage Notes (Addendum)
Pt very anxious at triage, accompanied by his mother who is a retired Optometristpediatrician. Pt states he ate chips and had a rash yesterday. Pt also states one day he has constipation, then the next day has diarrhea. Mother concerned that he has an impaction. Pt states he feels like he is dying. With periods of dizziness. Pt states he wakes up early and cannot go back to sleep and has sleeping troubles, pt speaking rapidly, hard to keep up with everything he is saying in triage. Mother states she has been treating him with Gatorade and hydration, since she is a former pediatrician.   Pt appears highly anxious. Pt states he does see a psychiatrist and has had doses changed recently, he is on many medications sand has been playing with the schedule and doses.  Pt and mother arguing over symptoms and tests he should have performed.

## 2017-09-16 NOTE — ED Notes (Signed)
Patient given discharge instructions and verbalized understanding.  Patient stable to discharge at this time.  Patient is alert and oriented to baseline.  No distressed noted at this time.  All belongings taken with the patient at discharge.   

## 2017-09-16 NOTE — ED Notes (Signed)
Gave Pt water per [facility]

## 2017-09-16 NOTE — ED Notes (Signed)
PA at the bedside.

## 2017-09-16 NOTE — ED Provider Notes (Signed)
[facility] EMERGENCY DEPARTMENT Provider Note   CSN: [id number] Arrival date & time: 09/16/17  1254     History   Chief Complaint Chief Complaint  Patient presents with  . multiple complaints  . medication adjustment    HPI Charles Copeland is a 22 y.o. male brought in by his mother with complaint of syncope.  He has a past medical history of pectus excavatum, anxiety and chronic depression.  He also has an unknown connective tissue disorder.  He has had an extensive workup and his last echocardiogram was 10 years ago.  Patient states that yesterday he ate potato chips and that he broke out with an itchy and burning rash on his face.  His mother states she was at home but he took Benadryl.  His rash improved however he began feeling extremely anxious and short of breath.  He has a history of orthostatic hypotension which is well-known to his primary care physician and is thought to be secondary to his connective tissue disorder.  Patient has also had a history of syncope.  He feels very frustrated because he feels that he is having these episodes that are brought on by physiologic condition however being patient presents with patient states that today blamed on his anxiety.  He felt his heart racing and got diaphoretic and then lost consciousness.  He has had episodes like this before.  He states that sometimes he feels like his mind is racing just prior to it occurring in makes him feel extremely anxious because he feels like he is "going to die."  He states that when he feels like this he is tries to sit down, close his eyes and count his breath so that he is taking slow deep inhalations and exhalations.  He does not feel that he was hyperventilating.  He denies unilateral leg swelling, hemoptysis, or history of DVT or blood clots.  He denies chest pain, cough or shortness of breath.  Patient states that every time he stands up he feels very lightheaded.  HPI  Past Medical  History:  Diagnosis Date  . Allergy   . Anxiety   . Asthma   . Depression   . MVP (mitral valve prolapse)     Patient Active Problem List   Diagnosis Date Noted  . Syncope 04/06/2017  . Preventative health care 05/13/2016  . Ringworm 05/13/2016  . 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 ANXIETY DISORDER 12/30/2009  . SCOLIOSIS 10/04/2007  . MDD (major depressive disorder) 07/23/2007  . ECZEMA 04/19/2007  . 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 nasal cautery  02/09   in OR  . NASAL SEPTUM SURGERY  7/13   [facility]--Dr Evans  . ORIF RADIAL FRACTURE  03/09   Left proximal   . TONSILLECTOMY AND ADENOIDECTOMY  2007       Home Medications    Prior to Admission medications   Medication Sig Start Date End Date Taking? Authorizing Provider  cholecalciferol (VITAMIN D) 1000 UNITS tablet Take 1,000 Units by mouth daily.   Yes [provider]  diazepam (VALIUM) 5 MG tablet Take 2.5-5 mg by mouth See admin instructions. 2.5mg  in the morning and 5mg  at bedtime   Yes [provider]  diphenhydrAMINE (BENADRYL) 25 mg capsule Take 50 mg by mouth every 6 (six) hours as needed for allergies.   Yes [provider]  escitalopram (LEXAPRO) 20 MG tablet Take 1 tablet (  20 mg total) by mouth daily. Patient taking differently: Take 1 tablet (30) mg total) by mouth daily. 12/16/13  Yes Karie SchwalbeLetvak, Richard I, MD  meclizine (ANTIVERT) 25 MG tablet Take 1 tablet (25 mg total) by mouth every 4 (four) hours as needed for dizziness. 05/05/17  Yes Nelwyn SalisburyFry, Stephen A, MD  Multiple Vitamin (MULTIVITAMIN WITH MINERALS) TABS Take 1 tablet by mouth daily.   Yes [provider]  ziprasidone (GEODON) 40 MG capsule Take 60 mg by mouth at bedtime.  04/24/17  Yes [provider]  LORazepam (ATIVAN) 1 MG tablet Take 1 tablet (1 mg total) by mouth 3 (three) times daily as needed for anxiety. Patient not taking:  Reported on 09/16/2017 05/21/17   Liberty HandyGibbons, Claudia J, PA-C    Family History Family History  Problem Relation Age of Onset  . Asthma Mother   . GER disease Mother   . Eczema Mother   . Allergies Mother   . Alcohol abuse Maternal Grandfather        ? bipolar  . Cancer Paternal Grandfather        LIVER    Social History Social History   Tobacco Use  . Smoking status: Never Smoker  . Smokeless tobacco: Never Used  Substance Use Topics  . Alcohol use: No    Alcohol/week: 0.0 oz  . Drug use: No     Allergies   Remeron [mirtazapine]; Other; Seroquel [quetiapine fumarate]; and Aripiprazole   Review of Systems Review of Systems Ten systems reviewed and are negative for acute change, except as noted in the HPI.    Physical Exam Updated Vital Signs BP 138/75   Pulse 90   Temp 99.2 F (37.3 C) (Oral)   Resp 12   Ht 6\' 2"  (1.88 m)   Wt 72.6 kg (160 lb)   SpO2 100%   BMI 20.54 kg/m   Physical Exam  Constitutional: He appears well-developed and well-nourished. No distress.  HENT:  Head: Normocephalic and atraumatic.  Eyes: Conjunctivae are normal. No scleral icterus.  Neck: Normal range of motion. Neck supple.  Cardiovascular: Normal rate, regular rhythm and normal heart sounds.  Pulmonary/Chest: Effort normal and breath sounds normal. No respiratory distress.  Abdominal: Soft. There is no tenderness.  Musculoskeletal: He exhibits no edema.  Neurological: He is alert.  Skin: Skin is warm and dry. He is not diaphoretic.  Psychiatric: His behavior is normal.  Anxious, flat affect  Nursing note and vitals reviewed.    ED Treatments / Results  Labs (all labs ordered are listed, but only abnormal results are displayed) Labs Reviewed  COMPREHENSIVE METABOLIC PANEL - Abnormal; Notable for the following components:      Result Value   ALT 15 (*)    All other components within normal limits  RAPID URINE DRUG SCREEN, HOSP PERFORMED - Abnormal; Notable for the  following components:   Benzodiazepines POSITIVE (*)    All other components within normal limits  LIPASE, BLOOD  CBC  URINALYSIS, ROUTINE W REFLEX MICROSCOPIC  I-STAT TROPONIN, ED    EKG  EKG Interpretation None       Radiology No results found.  Procedures Procedures (including critical care time)  Medications Ordered in ED Medications - No data to display   Initial Impression / Assessment and Plan / ED Course  I have reviewed the triage vital signs and the nursing notes.  Pertinent labs & imaging results that were available during my care of the patient were reviewed by me and  considered in my medical decision making (see chart for details).  Clinical Course as of Sep 16 2333  Sat Sep 16, 2017  1716 Patient orthostatic vs positive  [AH]    Clinical Course User Index [AH] Arthor Captain, PA-C    Patient feels extremely improved after 2 L bolus of fluids.  Patient advised to follow-up for outpatient echocardiogram, Holter monitoring and tilt table testing.  He is advised of all the above.  Mother agrees.  He should also have his polypharmacy reviewed by outpatient physician.  Patient appears appropriate for discharge at this time.  His labs are reviewed.  EKG without significant abnormality.  Final Clinical Impressions(s) / ED Diagnoses   Final diagnoses:  Syncope, unspecified syncope type  Orthostatic hypotension    ED Discharge Orders    None       Arthor Captain, PA-C 09/16/17 2349    Benjiman Core, MD 09/17/17 [phone]

## 2017-09-19 ENCOUNTER — Encounter: Payer: Self-pay | Admitting: Internal Medicine

## 2017-09-19 ENCOUNTER — Ambulatory Visit: Payer: Managed Care, Other (non HMO) | Admitting: Internal Medicine

## 2017-09-19 VITALS — BP 114/68 | HR 97 | Temp 98.3°F | Wt 166.0 lb

## 2017-09-19 DIAGNOSIS — R55 Syncope and collapse: Secondary | ICD-10-CM

## 2017-09-19 DIAGNOSIS — F411 Generalized anxiety disorder: Secondary | ICD-10-CM

## 2017-09-19 NOTE — Progress Notes (Signed)
Subjective:    Patient ID: Charles Copeland, male    DOB: [birth year], 22 y.o.   MRN: [id number]  HPI Here for ER follow up Mom is here  Gets dizzy feeling, head spinning, like I will pass out In this case, was on computer. Felt he would pass out--- laid down on floor This did help Got up to go downstairs and told his mom Still didn't feel good--was pale No vertigo  Didn't recur as bad but was very anxious---so wanted to go to ER Reviewed ER evaluation  Hadn't had this since bad episodes last summer Actually had syncope and abnormal posturing/seizure type activity  Current Outpatient Medications on File Prior to Visit  Medication Sig Dispense Refill  . cholecalciferol (VITAMIN D) 1000 UNITS tablet Take 1,000 Units by mouth daily.    . diazepam (VALIUM) 5 MG tablet Take 2.5-5 mg by mouth See admin instructions. 2.5mg  in the morning and 5mg  at bedtime    . diphenhydrAMINE (BENADRYL) 25 mg capsule Take 50 mg by mouth every 6 (six) hours as needed for allergies.    Marland Kitchen escitalopram (LEXAPRO) 10 MG tablet Take 10 mg by mouth daily.    Marland Kitchen escitalopram (LEXAPRO) 20 MG tablet Take 1 tablet (20 mg total) by mouth daily. (Patient taking differently: Take 1 tablet 20 mg total) by mouth daily.) 90 tablet 3  . LORazepam (ATIVAN) 1 MG tablet Take 1 tablet (1 mg total) by mouth 3 (three) times daily as needed for anxiety. 15 tablet 0  . meclizine (ANTIVERT) 25 MG tablet Take 1 tablet (25 mg total) by mouth every 4 (four) hours as needed for dizziness. 60 tablet 0  . Multiple Vitamin (MULTIVITAMIN WITH MINERALS) TABS Take 1 tablet by mouth daily.    . ziprasidone (GEODON) 20 MG capsule Take 20 mg by mouth 2 (two) times daily with a meal.    . ziprasidone (GEODON) 40 MG capsule Take 40 mg by mouth at bedtime.   0   No current facility-administered medications on file prior to visit.     Allergies  Allergen Reactions  . Remeron [Mirtazapine] Other (See Comments)    Hypotension, passed out  .  Other Hives    Lays Potato Chips  . Seroquel [Quetiapine Fumarate] Swelling    Excessive appetite  Stiff zombie  . Aripiprazole Other (See Comments)    tics    Past Medical History:  Diagnosis Date  . Allergy   . Anxiety   . Asthma   . Depression   . MVP (mitral valve prolapse)     Past Surgical History:  Procedure Laterality Date  . nasal cautery  02/09   in OR  . NASAL SEPTUM SURGERY  7/13   [facility]--Dr Evans  . ORIF RADIAL FRACTURE  03/09   Left proximal   . TONSILLECTOMY AND ADENOIDECTOMY  2007    Family History  Problem Relation Age of Onset  . Asthma Mother   . GER disease Mother   . Eczema Mother   . Allergies Mother   . Alcohol abuse Maternal Grandfather        ? bipolar  . Cancer Paternal Grandfather        LIVER    Social History   Socioeconomic History  . Marital status: Single    Spouse name: Not on file  . Number of children: Not on file  . Years of education: Not on file  . Highest education level: Not on file  Social Needs  .  Financial resource strain: Not on file  . Food insecurity - worry: Not on file  . Food insecurity - inability: Not on file  . Transportation needs - medical: Not on file  . Transportation needs - non-medical: Not on file  Occupational History  . Occupation: Student --[employer]  Tobacco Use  . Smoking status: Never Smoker  . Smokeless tobacco: Never Used  Substance and Sexual Activity  . Alcohol use: No    Alcohol/week: 0.0 oz  . Drug use: No  . Sexual activity: Not on file  Other Topics Concern  . Not on file  Social History Narrative   Mother: retired Optometristediatrician   Father: Lab director-[employer]   One brother   No smokers in house   Review of Systems  Has been eating okay Drinking lots of water Sleeps okay--though has early AM awakening and can't go back to sleep     Objective:   Physical Exam  Constitutional: No distress.  Neck: No thyromegaly present.  Cardiovascular: Normal rate, regular rhythm,  normal heart sounds and intact distal pulses. Exam reveals no gallop.  No murmur heard. Pulmonary/Chest: Effort normal and breath sounds normal. No respiratory distress. He has no wheezes. He has no rales.  Abdominal: Soft. There is no tenderness.  Musculoskeletal: He exhibits no edema.  Lymphadenopathy:    He has no cervical adenopathy.  Psychiatric:  Somewhat passive          Assessment & Plan:

## 2017-09-19 NOTE — Assessment & Plan Note (Signed)
Still seeing the psychiatrist Added valium for night panic---may be not feeling as well on this Unsure the geodon is helping Has follow up with psychiatrist soon

## 2017-09-19 NOTE — Assessment & Plan Note (Signed)
Presyncope this time Not clear that his meds are part of the problem Extensive work up in past--doubt we are missing something serious Might want to consider flurinef

## 2017-10-20 ENCOUNTER — Ambulatory Visit: Payer: Self-pay | Admitting: Internal Medicine

## 2018-01-08 ENCOUNTER — Telehealth: Payer: Self-pay

## 2018-01-08 NOTE — Telephone Encounter (Signed)
PLEASE NOTE: All timestamps contained within this report are represented as [place] Standard Time. CONFIDENTIALTY NOTICE: This fax transmission is intended only for the addressee. It contains information that is legally privileged, confidential or otherwise protected from use or disclosure. If you are not the intended recipient, you are strictly prohibited from reviewing, disclosing, copying using or disseminating any of this information or taking any action in reliance on or regarding this information. If you have received this fax in error, please notify us immediately by telephone so that we can arrange for its return to [place]. Phone: [phone], Toll-Free: [phone], Fax: [phone] Page: 1 of 2 Call Id: [id number] [facility] Night - Client TELEPHONE ADVICE RECORD [facility] Patient Name: Charles Copeland Gender: Male DOB: 1996-07-01 Age: 22 Y 1 M 28 D Return Phone Number: [phone] (Primary), [phone] (Secondary) Address: [place] [address] Client [facility] Night - Client Client Site [facility] - Night Physician Tillman Abide - MD Contact Type Call Who Is Calling Patient / Member / Family / Caregiver Call Type Triage / Clinical Relationship To Patient Self Return Phone Number [phone] (Secondary) Chief Complaint Rectal Bleeding Reason for Call Symptomatic / Request for Health Information Initial Comment Caller states he has alot of blood coming out of his rectum, liquid blood, feeling sickish, tired, and wondering what to do. He is wanting to schedule an appt for in the morning as well. Translation No Nurse Assessment Nurse: Trudie Reed, RN, Tabatha Date/Time (Eastern Time): 01/07/2018 1:59:32 PM Confirm and document reason for call. If symptomatic, describe symptoms. ---Caller states he has alot of blood coming out of his rectum, liquid blood, feeling sickish,  tired, and wondering what to do. He is wanting to schedule an appt for in the morning as well. Does the patient have any new or worsening symptoms? ---Yes Will a triage be completed? ---Yes Related visit to physician within the last 2 weeks? ---Yes Does the PT have any chronic conditions? (i.e. diabetes, asthma, etc.) ---Yes List chronic conditions. ---anxiety Is this a behavioral health or substance abuse call? ---No Guidelines Guideline Title Affirmed Question Affirmed Notes Nurse Date/Time (Eastern Time) Rectal Bleeding [1] MODERATE rectal bleeding (small blood clots, passing blood without stool, or toilet water turns red) AND [2] more than once a day Trudie Reed, RN, Tabatha 01/07/2018 1:59:45 PM Disp. Time Lamount Cohen Time) Disposition Final User 01/07/2018 2:05:11 PM Go to ED Now Yes Trudie Reed, RN, Tabatha PLEASE NOTE: All timestamps contained within this report are represented as [place] Standard Time. CONFIDENTIALTY NOTICE: This fax transmission is intended only for the addressee. It contains information that is legally privileged, confidential or otherwise protected from use or disclosure. If you are not the intended recipient, you are strictly prohibited from reviewing, disclosing, copying using or disseminating any of this information or taking any action in reliance on or regarding this information. If you have received this fax in error, please notify us immediately by telephone so that we can arrange for its return to [place]. Phone: [phone], Toll-Free: [phone], Fax: [phone] Page: 2 of 2 Call Id: [id number] Caller Disagree/Comply Comply Caller Understands Yes PreDisposition InappropriateToAsk Care Advice Given Per Guideline GO TO ED NOW: You need to be seen in the Emergency Department. Go to the ER at ___________ Hospital. Leave now. Drive carefully. CARE ADVICE given per Rectal Bleeding (Adult) guideline. Referrals GO TO FACILITY UNDECIDED

## 2018-01-08 NOTE — Telephone Encounter (Signed)
Unable to reach pt at any of his contact #s.

## 2018-01-09 ENCOUNTER — Ambulatory Visit: Payer: Self-pay

## 2018-01-09 NOTE — Telephone Encounter (Signed)
Patient called in with c/o "rectal bleeding." He says "it started about 2 days ago, not every time I have a bowel movement, but most times. I am having abdominal cramping, but no diarrhea. The bowel movement is soft and the blood is mixed in and turns the water red. I don't think it was that much that passed, no blood clots. I am having a little dizziness, but not bad." I asked about diarrhea, vomiting, fever, SOB, he denies. According to protocol, see PCP within 3 days, no availability with PCP, appointment scheduled for tomorrow at 1230 with Dr. Milinda Antis, care advice given, patient verbalized understanding.  Reason for Disposition . MILD rectal bleeding (more than just a few drops or streaks)  Answer Assessment - Initial Assessment Questions 1. APPEARANCE of BLOOD: "What color is it?" "Is it passed separately, on the surface of the stool, or mixed in with the stool?"      Bright red, mixed in the stool 2. AMOUNT: "How much blood was passed?"      Not that much 3. FREQUENCY: "How many times has blood been passed with the stools?"      4 times  4. ONSET: "When was the blood first seen in the stools?" (Days or weeks)      2 days ago 5. DIARRHEA: "Is there also some diarrhea?" If so, ask: "How many diarrhea stools were passed in past 24 hours?"      No 6. CONSTIPATION: "Do you have constipation?" If so, "How bad is it?"     No 7. RECURRENT SYMPTOMS: "Have you had blood in your stools before?" If so, ask: "When was the last time?" and "What happened that time?"      Nothing this bad 8. BLOOD THINNERS: "Do you take any blood thinners?" (e.g., Coumadin/warfarin, Pradaxa/dabigatran, aspirin)     No 9. OTHER SYMPTOMS: "Do you have any other symptoms?"  (e.g., abdominal pain, vomiting, dizziness, fever)     Abdominal cramping, a little dizziness 10. PREGNANCY: "Is there any chance you are pregnant?" "When was your last menstrual period?"       N/A  Protocols used: RECTAL BLEEDING-A-AH

## 2018-01-10 ENCOUNTER — Ambulatory Visit: Payer: Managed Care, Other (non HMO) | Admitting: Family Medicine

## 2018-01-10 ENCOUNTER — Encounter: Payer: Self-pay | Admitting: Family Medicine

## 2018-01-10 VITALS — BP 114/72 | HR 78 | Temp 98.0°F | Ht 74.0 in | Wt 166.0 lb

## 2018-01-10 DIAGNOSIS — F411 Generalized anxiety disorder: Secondary | ICD-10-CM

## 2018-01-10 DIAGNOSIS — R197 Diarrhea, unspecified: Secondary | ICD-10-CM

## 2018-01-10 DIAGNOSIS — R5382 Chronic fatigue, unspecified: Secondary | ICD-10-CM

## 2018-01-10 DIAGNOSIS — R5383 Other fatigue: Secondary | ICD-10-CM | POA: Insufficient documentation

## 2018-01-10 NOTE — Assessment & Plan Note (Signed)
For 2-3 days with an episode of bloody stool in pt with hx of hemorrhoid Entirely resolved now  Pt states he had constipation with the psych meds (lexapro and geodon) -then diarrhea when he withdrew from them   Also straining and rectal discomfort gone  Did not do rectal exam today as pt said symptoms were gone  Did discuss brother's hx of Crohn's dz and need to watch for that (also lost wt but pt also blames that on his psych med)  Enc pt to alert [place] immediately if any GI symptoms return for this reason  Also offered lab work for thyroid/cbc-he declined for now

## 2018-01-10 NOTE — Progress Notes (Signed)
Subjective:    Patient ID: Charles Copeland, male    DOB: 1996-01-19, 22 y.o.   MRN: [id number]  HPI Here for abd cramping and blood in stools   (some improvement today) Has a hemorrhoid as well  22 yo pt of Dr Alphonsus Sias with hx of ADD and gen anxiety and scoliosis   He had issues for a long time -thought due to med  lexapro and geodon -caused constipation and low bp /syncope  Recently taken off of both  Feels quite a bit better (thought he had some withdrawal)  Was on prozac for 3 days-affected his mental status and stopped it - ? If absence seizure   3 days ago - some diarrhea with bright red blood in it -this followed the withdrawal from his psychiatric medicines  Also anxiety-now just starting to sleep  This am-normal bm - no blood or pain or cramping  Some soreness in anal area-that is improved today  Nausea is gone  Just tired today  Some headaches- no fever    Appetite is good today   BP Readings from Last 3 Encounters:  01/10/18 114/72  09/19/17 114/68  09/16/17 130/75   Pulse Readings from Last 3 Encounters:  01/10/18 78  09/19/17 97  09/16/17 84    Wt Readings from Last 3 Encounters:  01/10/18 166 lb (75.3 kg)  09/19/17 166 lb (75.3 kg)  09/16/17 160 lb (72.6 kg)  weighted 180 last august-he thinks he lost weight due to his medicine  Of note-brother has hx of Crohns dz   .21.31 kg/m  Patient Active Problem List   Diagnosis Date Noted  . Diarrhea 01/10/2018  . Fatigue 01/10/2018  . Syncope 04/06/2017  . Preventative health care 05/13/2016  . Ringworm 05/13/2016  . 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 Generalized anxiety disorder 12/30/2009  . SCOLIOSIS 10/04/2007  . MDD (major depressive disorder) 07/23/2007  . ECZEMA 04/19/2007  . PECTUS EXCAVATUM 04/19/2007   Past Medical History:  Diagnosis Date  . Allergy   . Anxiety   . Asthma   . Depression   . MVP (mitral valve prolapse)    Past Surgical History:  Procedure  Laterality Date  . nasal cautery  02/09   in OR  . NASAL SEPTUM SURGERY  7/13   [facility]--Dr Evans  . ORIF RADIAL FRACTURE  03/09   Left proximal   . TONSILLECTOMY AND ADENOIDECTOMY  2007   Social History   Tobacco Use  . Smoking status: Never Smoker  . Smokeless tobacco: Never Used  Substance Use Topics  . Alcohol use: No    Alcohol/week: 0.0 oz  . Drug use: No   Family History  Problem Relation Age of Onset  . Asthma Mother   . GER disease Mother   . Eczema Mother   . Allergies Mother   . Alcohol abuse Maternal Grandfather        ? bipolar  . Cancer Paternal Grandfather        LIVER   Allergies  Allergen Reactions  . Lexapro [Escitalopram] Other (See Comments)    Hypotension, syncope  . Remeron [Mirtazapine] Other (See Comments)    Hypotension, passed out  . Geodon [Ziprasidone] Other (See Comments)    Syncope, Hypotension  . Other Hives    Lays Potato Chips  . Seroquel [Quetiapine Fumarate] Swelling    Excessive appetite  Stiff zombie  . Aripiprazole Other (See Comments)    tics   Current Outpatient Medications on  File Prior to Visit  Medication Sig Dispense Refill  . cholecalciferol (VITAMIN D) 1000 UNITS tablet Take 1,000 Units by mouth daily.    . diazepam (VALIUM) 5 MG tablet Take 2.5-5 mg by mouth See admin instructions. 2.5mg  in the morning and  at bedtime    . diphenhydrAMINE (BENADRYL) 25 mg capsule Take 50 mg by mouth every 6 (six) hours as needed for allergies.    . fexofenadine (ALLEGRA) 180 MG tablet Take 180 mg by mouth daily.    . Multiple Vitamin (MULTIVITAMIN WITH MINERALS) TABS Take 1 tablet by mouth daily.     No current facility-administered medications on file prior to visit.     Review of Systems  Constitutional: Negative for activity change, appetite change, fatigue, fever and unexpected weight change.  HENT: Negative for congestion, rhinorrhea, sore throat and trouble swallowing.   Eyes: Negative for pain, redness, itching and  visual disturbance.  Respiratory: Negative for cough, chest tightness, shortness of breath and wheezing.   Cardiovascular: Negative for chest pain and palpitations.  Gastrointestinal: Negative for abdominal distention, abdominal pain, anal bleeding, blood in stool, constipation, diarrhea, nausea, rectal pain and vomiting.       GI symptoms have all resolved today  Endocrine: Negative for cold intolerance, heat intolerance, polydipsia and polyuria.  Genitourinary: Negative for difficulty urinating, dysuria, frequency and urgency.  Musculoskeletal: Negative for arthralgias, joint swelling and myalgias.  Skin: Negative for pallor and rash.  Neurological: Negative for dizziness, tremors, weakness, numbness and headaches.  Hematological: Negative for adenopathy. Does not bruise/bleed easily.  Psychiatric/Behavioral: Positive for sleep disturbance. Negative for agitation, behavioral problems, decreased concentration, dysphoric mood, self-injury and suicidal ideas. The patient is nervous/anxious.        Objective:   Physical Exam  Constitutional: He is oriented to person, place, and time. He appears well-developed and well-nourished. No distress.  Slim and well appearing   HENT:  Head: Normocephalic and atraumatic.  Mouth/Throat: Oropharynx is clear and moist.  Eyes: Pupils are equal, round, and reactive to light. Conjunctivae and EOM are normal.  Neck: Normal range of motion. Neck supple. No thyromegaly present.  Cardiovascular: Normal rate, regular rhythm and normal heart sounds.  No murmur heard. No M heard Pt states he has a hx of MVP  Pulmonary/Chest: Effort normal and breath sounds normal. No stridor. No respiratory distress. He has no wheezes.  Abdominal: Soft. Bowel sounds are normal. He exhibits no distension, no abdominal bruit, no pulsatile midline mass and no mass. There is no hepatosplenomegaly. There is no tenderness. There is no rigidity, no rebound, no guarding, no CVA  tenderness, no tenderness at McBurney's point and negative Murphy's sign. No hernia.  Soft and nt  Musculoskeletal: He exhibits no edema.  Scoliosis   Lymphadenopathy:    He has no cervical adenopathy.  Neurological: He is alert and oriented to person, place, and time. He displays normal reflexes.  No tremor   Skin: Skin is warm and dry.  No jaundice   Psychiatric: His behavior is normal. Thought content normal. His mood appears anxious. His affect is not blunt, not labile and not inappropriate. His speech is rapid and/or pressured. He is not agitated, not aggressive and not hyperactive. Thought content is not paranoid. He does not exhibit a depressed mood. He expresses no homicidal and no suicidal ideation.  Pleasant and talkative Talks very quietly and quickly in anxious fashion            Assessment & Plan:   Problem  List Items Addressed This Visit      Other   Diarrhea    For 2-3 days with an episode of bloody stool in pt with hx of hemorrhoid Entirely resolved now  Pt states he had constipation with the psych meds (lexapro and geodon) -then diarrhea when he withdrew from them   Also straining and rectal discomfort gone  Did not do rectal exam today as pt said symptoms were gone  Did discuss brother's hx of Crohn's dz and need to watch for that (also lost wt but pt also blames that on his psych med)  Enc pt to alert [place] immediately if any GI symptoms return for this reason  Also offered lab work for thyroid/cbc-he declined for now      Fatigue    Pt states he is not sleeping well due to w/d from lexapro and geodon - that is reason (however overall feels better) Offered lab work-he will call if symptoms do not improve      Generalized anxiety disorder - Primary    Pt recently stopped lexapro and geodon which caused (per pt) side eff of syncope /constipation and malaise  Also prozac which may have caused a seizure  He is currently having some w/d symptoms including  sleeplessness and brief GI upset but overall claims to feel much better  He will f/u with his psychiatrist  Seems anxious on today's exam

## 2018-01-10 NOTE — Patient Instructions (Addendum)
I'm glad you feel better  I suspect the diarrhea/cramping were from coming off of your psychiatric medicines (and the blood from a hemorrhoid)   However-if diarrhea/bloody stool or cramping return please let us know  We may want to consider evaluation for chron's since it runs in your family   Do contact your psychiatrist regarding withdrawal symptoms from medication/ sleep issues  Try to get some rest   Also watch for fever /nausea or other symptoms   If rectal irritation comes back- you can try some prep H   If fatigue continues we can do some labs for thyroid and blood count

## 2018-01-10 NOTE — Assessment & Plan Note (Signed)
Pt recently stopped lexapro and geodon which caused (per pt) side eff of syncope /constipation and malaise  Also prozac which may have caused a seizure  He is currently having some w/d symptoms including sleeplessness and brief GI upset but overall claims to feel much better  He will f/u with his psychiatrist  Seems anxious on today's exam

## 2018-01-10 NOTE — Assessment & Plan Note (Signed)
Pt states he is not sleeping well due to w/d from lexapro and geodon - that is reason (however overall feels better) Offered lab work-he will call if symptoms do not improve

## 2018-01-11 NOTE — Telephone Encounter (Signed)
Pt saw Dr Milinda Antis this week.

## 2018-01-16 ENCOUNTER — Telehealth: Payer: Self-pay

## 2018-01-16 NOTE — Telephone Encounter (Signed)
Spoke to pt's Mom. She said he is doing much better! He is trying school, again.

## 2018-02-13 ENCOUNTER — Encounter: Payer: Self-pay | Admitting: *Deleted

## 2018-02-13 ENCOUNTER — Encounter (INDEPENDENT_AMBULATORY_CARE_PROVIDER_SITE_OTHER): Payer: Self-pay

## 2018-02-13 ENCOUNTER — Ambulatory Visit: Payer: Self-pay | Admitting: *Deleted

## 2018-02-13 ENCOUNTER — Encounter: Payer: Self-pay | Admitting: Family Medicine

## 2018-02-13 ENCOUNTER — Ambulatory Visit: Payer: Managed Care, Other (non HMO) | Admitting: Family Medicine

## 2018-02-13 VITALS — BP 130/90 | HR 68 | Temp 98.3°F | Ht 74.0 in | Wt 166.0 lb

## 2018-02-13 DIAGNOSIS — R55 Syncope and collapse: Secondary | ICD-10-CM | POA: Diagnosis not present

## 2018-02-13 DIAGNOSIS — F411 Generalized anxiety disorder: Secondary | ICD-10-CM | POA: Diagnosis not present

## 2018-02-13 DIAGNOSIS — R410 Disorientation, unspecified: Secondary | ICD-10-CM | POA: Diagnosis not present

## 2018-02-13 DIAGNOSIS — Z8659 Personal history of other mental and behavioral disorders: Secondary | ICD-10-CM

## 2018-02-13 NOTE — Progress Notes (Signed)
Subjective:    Patient ID: Charles Copeland, male    DOB: 04-Dec-1995, 22 y.o.   MRN: [id number]  HPI   22 year old male pt of [facility] with history of MDD and GAD, ADD presents with  epsiodes of confusion.   Seen by Dr. Milinda Antis on 01/10/2018 for cramping and abdominal pain: pt recently stopped lexapro and geodon given SE of syncope, hypotension and constipation. He states that he was told by psychiatry that he did not need these meds.  he feels the med have always made his anxiety worse and none ever paid attention to his complaints. Always was explained away by MDs and his parents as anxiety.   In last few days he has had 2 spells of feeling confused,  Someone is talking to him and he feels like he does not get the correct meaning.  Confusion limited to the conversation and later he feels he understands what the are doing/ saying..  No neuro cahnges otherwise, no numness, no weakness. No one else has commented on the confusion, no LOC, aware and thinking the whole time.  No CP, not lightheaded, no HA.   Feel like having NO anxiety, no depression.  he is adamant that his symptoms are nott from anxiety or a need to restart the medications.   He is very constipated.Marland Kitchen He has used a laxative, not sure what. BMs occurring every few days.. Softer stool but cannot get BM out, sticky.  no slurred speech.  no headaches.  Hx of possible SE prozac, ? Absence seizures.  head injury 7th grade.. LOC.    Blood pressure 130/90, pulse 68, temperature 98.3 F (36.8 C), temperature source Oral, height 6\' 2"  (1.88 m), weight 166 lb (75.3 kg).  Review of Systems  Constitutional: Negative for fatigue and fever.  HENT: Negative for ear pain.   Eyes: Negative for pain.  Respiratory: Negative for cough and shortness of breath.   Cardiovascular: Negative for chest pain, palpitations and leg swelling.  Gastrointestinal: Negative for abdominal pain.  Genitourinary: Negative for dysuria.  Musculoskeletal:  Negative for arthralgias.  Neurological: Negative for syncope, light-headedness and headaches.  Psychiatric/Behavioral: Negative for dysphoric mood.       Objective:   Physical Exam  Constitutional: He is oriented to person, place, and time. Vital signs are normal. He appears well-developed and well-nourished.  HENT:  Head: Normocephalic.  Right Ear: Hearing normal.  Left Ear: Hearing normal.  Nose: Nose normal.  Mouth/Throat: Oropharynx is clear and moist and mucous membranes are normal.  Neck: Trachea normal. Carotid bruit is not present. No thyroid mass and no thyromegaly present.  Cardiovascular: Normal rate, regular rhythm and normal pulses. Exam reveals no gallop, no distant heart sounds and no friction rub.  No murmur heard. No peripheral edema  Pulmonary/Chest: Effort normal and breath sounds normal. No respiratory distress.  Neurological: He is alert and oriented to person, place, and time. He has normal strength. He is not disoriented. No cranial nerve deficit or sensory deficit. He displays a negative Romberg sign. Coordination and gait normal. GCS eye subscore is 4. GCS verbal subscore is 5. GCS motor subscore is 6.  Skin: Skin is warm, dry and intact. No rash noted.  Psychiatric: His speech is normal and behavior is normal. Thought content normal. His mood appears anxious. He does not exhibit a depressed mood.   Initially he seems anxious and agitated but as the appt goes on his affect becomes normal.  Of note I was  45 min behind and he had to wait.          Assessment & Plan:

## 2018-02-13 NOTE — Telephone Encounter (Signed)
Noted  

## 2018-02-13 NOTE — Telephone Encounter (Signed)
Pt reports increased anxiety past 2 days. States difficulty concentrating, difficulty comprehending conversations at times. Reports "rambling speech at times." States "Catches himself" and self corrects when occurs. States has been off anxiety meds for approximately 3 weeks. Denies any other symptoms; no suicidal ideation. States "I'm not depressed." Denies any other symptoms other than "Constipation which is ongoing." Last episode of "Not comprehending what others say" was yesterday. Did recognize later that he had misunderstood and responded inappropriately. 30 minute appt made with Dr. Ermalene SearingBedsole for today. Directed pt to ED if symptoms worsen. Verbalizes understanding.  Reason for Disposition . Patient sounds very upset or troubled to the triager  Answer Assessment - Initial Assessment Questions 1. CONCERN: "What happened that made you call today?"     Can't comprehend what people are saying at times 2. ANXIETY SYMPTOM SCREENING: "Can you describe how you have been feeling?"  (e.g., tense, restless, panicky, anxious, keyed up, trouble sleeping, trouble concentrating)     Anxious, trouble concentrating 3. ONSET: "How long have you been feeling this way?"     H/O   4. RECURRENT: "Have you felt this way before?"  If yes: "What happened that time?" "What helped these feelings go away in the past?"      H/O anxiety 5. RISK OF HARM - SUICIDAL IDEATION:  "Do you ever have thoughts of hurting or killing yourself?"  (e.g., yes, no, no but preoccupation with thoughts about death)   - INTENT:  "Do you have thoughts of hurting or killing yourself right NOW?" (e.g., yes, no, N/A)   - PLAN: "Do you have a specific plan for how you would do this?" (e.g., gun, knife, overdose, no plan, N/A)     none 6. RISK OF HARM - HOMICIDAL IDEATION:  "Do you ever have thoughts of hurting or killing someone else?"  (e.g., yes, no, no but preoccupation with thoughts about death)   - INTENT:  "Do you have thoughts of hurting or  killing someone right NOW?" (e.g., yes, no, N/A)   - PLAN: "Do you have a specific plan for how you would do this?" (e.g., gun, knife, no plan, N/A)      none 7. FUNCTIONAL IMPAIRMENT: "How have things been going for you overall in your life? Have you had any more difficulties than usual doing your normal daily activities?"  (e.g., better, same, worse; self-care, school, work, interactions)     Able to take on-line classes 8. SUPPORT: "Who is with you now?" "Who do you live with?" "Do you have family or friends nearby who you can talk to?"     Lives with mother        5112. SUBSTANCE ABUSE: "Do you use any illegal drugs or alcohol?"       no 13. OTHER SYMPTOMS: "Do you have any other physical symptoms right now?" (e.g., chest pain, palpitations, difficulty breathing, fever)       no  Protocols used: ANXIETY AND PANIC ATTACK-A-AH

## 2018-02-13 NOTE — Patient Instructions (Addendum)
Increase water in diet, increased fiber in diet, consider adding benefiber.  Start miralax daily.  Get back to healthy lifestyle, regular diet, exercise and sleep..  If any further concerns.. call

## 2018-02-15 DIAGNOSIS — R410 Disorientation, unspecified: Secondary | ICD-10-CM | POA: Insufficient documentation

## 2018-02-15 NOTE — Assessment & Plan Note (Signed)
Has resolved entirely off medications.

## 2018-02-15 NOTE — Assessment & Plan Note (Signed)
Per pt he is doing better with this off medication.  he is not interested in restarting. Discussed with him stress reduction and relaxation. I mentioned to him the benefit a=of being in tune with his mood and given issues in past to be sensitive to the possibility of return of anxiety the further he is from stopping the medication. He is agreeable.

## 2018-02-15 NOTE — Assessment & Plan Note (Signed)
Nml neuro exam. Symtposm not typical of absence seizures as he is thinking through entire momentary episode, but is just not getting the meaning of the person talking. Few minutes later he has understanding.   This may be due to coming off psychotropic meds vs cloudy thinking from a return of mood disorder( he denies mood issues) vs other.  I reassured pt no clear other work up need and pt seem happy with reassurance.

## 2018-03-14 ENCOUNTER — Encounter: Payer: Self-pay | Admitting: Gastroenterology

## 2018-03-14 ENCOUNTER — Ambulatory Visit: Payer: Managed Care, Other (non HMO) | Admitting: Internal Medicine

## 2018-03-14 ENCOUNTER — Encounter: Payer: Self-pay | Admitting: Internal Medicine

## 2018-03-14 VITALS — BP 128/72 | HR 74 | Temp 98.1°F | Ht 74.0 in | Wt 166.0 lb

## 2018-03-14 DIAGNOSIS — R1084 Generalized abdominal pain: Secondary | ICD-10-CM

## 2018-03-14 DIAGNOSIS — R109 Unspecified abdominal pain: Secondary | ICD-10-CM | POA: Insufficient documentation

## 2018-03-14 NOTE — Assessment & Plan Note (Signed)
Has pattern of bowel changes consistent with IBS---diarrhea and constipation  Now having constipation Sees blood at times and has pain also Brother with Crohn's  Will make GI referral to consider colonoscopy Try regular miralax for now

## 2018-03-14 NOTE — Patient Instructions (Signed)
Please try 1/2 capful of miralax every day to see if you get into a regular pattern of going.

## 2018-03-14 NOTE — Progress Notes (Signed)
Subjective:    Patient ID: Charles Copeland, male    DOB: 1995-11-20, 22 y.o.   MRN: [id number]  HPI Here due to bowel issues Feels constipated---then he goes and sees non digested foods (broccoli and other vegetables) liquidy at times---but still hard to push out (pulls it out at times) Not helped by increased water or fiber intake Varies from constipation to "not"  Averages going once a day If goes multiple days--will take miralax and that works (by the next day) Has needed it 2-3 times a week Does see blood at times--comes and goes  Problems go way back Especially bad when working at [employer] (more of urgency and loose stools then) Gets some pain when he is constipated (like in low back)  Taking on line classes Not working currently  Feels his mood is okay  Wasn't convinced the medication helped and it made him feel bad  Current Outpatient Medications on File Prior to Visit  Medication Sig Dispense Refill  . cholecalciferol (VITAMIN D) 1000 UNITS tablet Take 1,000 Units by mouth daily.    . diazepam (VALIUM) 5 MG tablet Take 2.5-5 mg by mouth See admin instructions. 2.5mg  in the morning and 5mg  at bedtime    . fexofenadine (ALLEGRA) 180 MG tablet Take 180 mg by mouth daily.    . Multiple Vitamin (MULTIVITAMIN WITH MINERALS) TABS Take 1 tablet by mouth daily.     No current facility-administered medications on file prior to visit.     Allergies  Allergen Reactions  . Lexapro [Escitalopram] Other (See Comments)    Hypotension, syncope  . Remeron [Mirtazapine] Other (See Comments)    Hypotension, passed out  . Geodon [Ziprasidone] Other (See Comments)    Syncope, Hypotension  . Other Hives    Lays Potato Chips  . Seroquel [Quetiapine Fumarate] Swelling    Excessive appetite  Stiff zombie  . Aripiprazole Other (See Comments)    tics    Past Medical History:  Diagnosis Date  . Allergy   . Anxiety   . Asthma   . Depression   . MVP (mitral valve prolapse)      Past Surgical History:  Procedure Laterality Date  . nasal cautery  02/09   in OR  . NASAL SEPTUM SURGERY  7/13   [facility]--Dr Evans  . ORIF RADIAL FRACTURE  03/09   Left proximal   . TONSILLECTOMY AND ADENOIDECTOMY  2007    Family History  Problem Relation Age of Onset  . Asthma Mother   . GER disease Mother   . Eczema Mother   . Allergies Mother   . Alcohol abuse Maternal Grandfather        ? bipolar  . Cancer Paternal Grandfather        LIVER    Social History   Socioeconomic History  . Marital status: Single    Spouse name: Not on file  . Number of children: Not on file  . Years of education: Not on file  . Highest education level: Not on file  Occupational History  . 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 Financial resource strain: Not on file  . Food insecurity:    Worry: Not on file    Inability: Not on file  . Transportation needs:    Medical: Not on file    Non-medical: Not on file  Tobacco Use  . Smoking status: Never Smoker  . Smokeless tobacco: Never Used  Substance and Sexual Activity  . Alcohol use:  No    Alcohol/week: 0.0 oz  . Drug use: No  . Sexual activity: Not on file  Lifestyle  . Physical activity:    Days per week: Not on file    Minutes per session: Not on file  . Stress: Not on file  Relationships  . Social connections:    Talks on phone: Not on file    Gets together: Not on file    Attends religious service: Not on file    Active member of club or organization: Not on file    Attends meetings of clubs or organizations: Not on file    Relationship status: Not on file  . Intimate partner violence:    Fear of current or ex partner: Not on file    Emotionally abused: Not on file    Physically abused: Not on file    Forced sexual activity: Not on file  Other Topics Concern  . Not on file  Social History Narrative   Mother: retired Optometristediatrician   Father: Lab director-[employer]   One brother   No smokers in house    Review of Systems Appetite is "pretty normal" Weight is stable (was more on the antipsychotics)    Objective:   Physical Exam  Constitutional: He appears well-developed. No distress.  Neck: No thyromegaly present.  Cardiovascular: Normal rate, regular rhythm and normal heart sounds. Exam reveals no gallop.  No murmur heard. Respiratory: Effort normal and breath sounds normal. No respiratory distress. He has no wheezes. He has no rales.  GI: Soft. Bowel sounds are normal. He exhibits no distension. There is no tenderness. There is no rebound and no guarding.  Genitourinary:  Genitourinary Comments: No rectal lesions (internal deferred)  Musculoskeletal: He exhibits no edema.  Lymphadenopathy:    He has no cervical adenopathy.           Assessment & Plan:

## 2018-04-09 ENCOUNTER — Ambulatory Visit: Payer: Self-pay | Admitting: *Deleted

## 2018-04-09 NOTE — Telephone Encounter (Signed)
The diazepam could make him dizzy or potentially lower his BP. If it seems to be helping him sleep, it may be reasonable to try 1/2 tab (2.5mg ) instead--to see if that helps sleep without the side effects

## 2018-04-09 NOTE — Telephone Encounter (Signed)
Spoke to pt. He said he feels like he is becoming dependent to the diazepam for his sleep everynight. The message was taken wrong. He has issues if he if doesn't take it. Dr Alphonsus SiasLetvak said it was fine for him to take it once a day at night and no more. We can discuss it at his OV on the 27th.

## 2018-04-09 NOTE — Telephone Encounter (Signed)
Pt calling with questions of if Valuim is the cause of bowel changes and if he is becoming more reliant on Valium for sleep.Pt states he has noted bowel changes with the amount of Valium that he takes. Pt states that he was taking Valuim 5mg  as needed at night for sleep as he was instructed. Pt states that he has not taken Valium for the past 2 nights and last night he woke up with his heart beating hard and experienced feeling lightheaded. Pt states he is a little lightheaded at this time but does not feel as if his heart is beating hard. Pt advised to seek treatment in the Urgent Care/ED if he continues to feel lightheaded and feels like he is going to pass out. Pt verbalized understanding.  Pt voiced concern of the medication dropping his BP but states he is finding it hard to sleep at night without taking the medication. Pt states he does not have a way to check BP at home and did not seek treatment last night when the event occurred.Pt states that prior to stopping the medication for 2 days he was taking it every night for 4 nights in a row.  Pt states he has not contacted psychologist regarding medication because he states "it takes so long to get an appointment and wanted to hear from Dr. Alphonsus SiasLetvak first". Pt wanting to know if Dr. Alphonsus SiasLetvak recommends for him to continue taking Valuim 5mg  or if he should stop the medication until he is seen for an appt. Pt scheduled for appt on 05/01/18. Pt has appt scheduled with GI on 8/26 regarding bowel issues.  Answer Assessment - Initial Assessment Questions 1. SYMPTOMS: "Do you have any symptoms?"     Last night pt felt a little lightheaded and felt like his heart was racing 2. SEVERITY: If symptoms are present, ask "Are they mild, moderate or severe?"     Severity not specified.  Protocols used: MEDICATION QUESTION CALL-A-AH

## 2018-04-09 NOTE — Telephone Encounter (Signed)
  Reason for Disposition . Caller has NON-URGENT medication question about med that PCP prescribed and triager unable to answer question  Protocols used: MEDICATION QUESTION CALL-A-AH  

## 2018-04-17 ENCOUNTER — Encounter: Payer: Self-pay | Admitting: Family Medicine

## 2018-04-17 ENCOUNTER — Ambulatory Visit: Payer: Managed Care, Other (non HMO) | Admitting: Family Medicine

## 2018-04-17 DIAGNOSIS — H6120 Impacted cerumen, unspecified ear: Secondary | ICD-10-CM | POA: Insufficient documentation

## 2018-04-17 DIAGNOSIS — H6123 Impacted cerumen, bilateral: Secondary | ICD-10-CM

## 2018-04-17 NOTE — Assessment & Plan Note (Signed)
Discussed ear wax prevention. Info given.

## 2018-04-17 NOTE — Patient Instructions (Signed)
No Qtips. Start OTC ear wax drop such as Cerumenex. 2-5 drops  Bilateral ear for the next 3-4 days, then once weekly.

## 2018-04-17 NOTE — Progress Notes (Signed)
   Subjective:    Patient ID: Charles Copeland, male    DOB: 09/18/1995, 22 y.o.   MRN: [id number]  Otalgia   There is pain in the right ear. This is a new problem. The current episode started in the past 7 days. The problem occurs constantly. The problem has been gradually worsening. There has been no fever. The patient is experiencing no pain. Pertinent negatives include no coughing, ear discharge, headaches or sore throat. He has tried nothing for the symptoms. The treatment provided no relief. There is no history of a chronic ear infection, hearing loss or a tympanostomy tube.   Has history of ear wax impaction.  Review of Systems  HENT: Positive for ear pain. Negative for ear discharge and sore throat.   Respiratory: Negative for cough.   Neurological: Negative for headaches.   Wt Readings from Last 3 Encounters:  04/17/18 159 lb (72.1 kg)  03/14/18 166 lb (75.3 kg)  02/13/18 166 lb (75.3 kg)  Body mass index is 20.41 kg/m.       Objective:   Physical Exam  Constitutional: Vital signs are normal. He appears well-developed and well-nourished.  HENT:  Head: Normocephalic.  Right Ear: Hearing normal.  Left Ear: Hearing normal.  Nose: Nose normal.  Mouth/Throat: Oropharynx is clear and moist and mucous membranes are normal.  Small amount of ear wax in bilateral ear canals, no impaction  Neck: Trachea normal. Carotid bruit is not present. No thyroid mass and no thyromegaly present.  Cardiovascular: Normal rate, regular rhythm and normal pulses. Exam reveals no gallop, no distant heart sounds and no friction rub.  No murmur heard. No peripheral edema  Pulmonary/Chest: Effort normal and breath sounds normal. No respiratory distress.  Skin: Skin is warm, dry and intact. No rash noted.  Psychiatric: He has a normal mood and affect. His speech is normal and behavior is normal. Thought content normal.          Assessment & Plan:

## 2018-04-27 ENCOUNTER — Encounter (HOSPITAL_COMMUNITY): Payer: Self-pay

## 2018-04-27 ENCOUNTER — Emergency Department (HOSPITAL_COMMUNITY)
Admission: EM | Admit: 2018-04-27 | Discharge: 2018-04-27 | Disposition: A | Discharge status: Home / Self Care | Payer: Managed Care, Other (non HMO) | Attending: Emergency Medicine | Admitting: Emergency Medicine

## 2018-04-27 ENCOUNTER — Other Ambulatory Visit: Payer: Self-pay

## 2018-04-27 DIAGNOSIS — F430 Acute stress reaction: Secondary | ICD-10-CM | POA: Insufficient documentation

## 2018-04-27 DIAGNOSIS — F329 Major depressive disorder, single episode, unspecified: Secondary | ICD-10-CM | POA: Insufficient documentation

## 2018-04-27 DIAGNOSIS — T43215A Adverse effect of selective serotonin and norepinephrine reuptake inhibitors, initial encounter: Secondary | ICD-10-CM | POA: Insufficient documentation

## 2018-04-27 DIAGNOSIS — T50905A Adverse effect of unspecified drugs, medicaments and biological substances, initial encounter: Secondary | ICD-10-CM

## 2018-04-27 DIAGNOSIS — J45909 Unspecified asthma, uncomplicated: Secondary | ICD-10-CM | POA: Insufficient documentation

## 2018-04-27 DIAGNOSIS — Y829 Unspecified medical devices associated with adverse incidents: Secondary | ICD-10-CM | POA: Insufficient documentation

## 2018-04-27 DIAGNOSIS — Z79899 Other long term (current) drug therapy: Secondary | ICD-10-CM | POA: Insufficient documentation

## 2018-04-27 DIAGNOSIS — R9431 Abnormal electrocardiogram [ECG] [EKG]: Secondary | ICD-10-CM | POA: Insufficient documentation

## 2018-04-27 DIAGNOSIS — T887XXA Unspecified adverse effect of drug or medicament, initial encounter: Secondary | ICD-10-CM | POA: Diagnosis not present

## 2018-04-27 NOTE — Discharge Instructions (Addendum)
You were seen today for medication adjustment.  Please stop the Lexapro.   Your EKG was consistent with prior, but if you are not already followed by a cardiologist.  I would like you discussed with Dr. Alphonsus SiasLetvak, as you have a very dynamic EKG.  I will send Dr. Alphonsus SiasLetvak an email regarding your reasons for visiting today.  Please return to the emergency department immediately if you develop any further seizure-like activity, confusion, chest pain, shortness of breath, palpitations, or any thoughts that you are having worsening mood or feeling that she want to harm yourself.

## 2018-04-27 NOTE — ED Triage Notes (Signed)
Pt endorses taking lexapro x 3 days due to side effects from autism. Pt feels like he was about to have a seizure due to the lexapro and has a hx of seizures. VSS. Neuro intact.

## 2018-04-27 NOTE — ED Provider Notes (Signed)
[facility] Note   CSN: [id number] Arrival date & time: 04/27/18  1622     History   Chief Complaint Chief Complaint  Patient presents with  . Anxiety    HPI Charles Copeland is a 22 y.o. male.  HPI  Patient is a 22 year old male with a history of asthma, anxiety, autism spectrum disorder, mitral valve prolapse, presenting for sensation that he might have a seizure.  Patient reports that he was recently started on Lexapro 3 days ago, he does not believe he needs to be on this medication.  Patient reports he was previously on an SSRI, and it caused him to "have a seizure".  Patient reports he had several episodes of seizure-like versus syncope episodes of January 2019 that resulted in hospitalization.  Patient reports [facility] stopped all of his medications.  Patient reports that the sensations he experienced today were transient episode of palpitations and feeling "absent".  Patient reports in the past these had a combination of tonic-clonic and absence like seizures.  Patient reports he wants to be completely off Lexapro and his 5 mg of Valium at night.  Patient denies being on any antiepileptics, headaches, vision changes, chest pain, shortness of breath, syncope or presyncope at present.  Patient denies any SI, HI, or AVH.  Past Medical History:  Diagnosis Date  . Allergy   . Anxiety   . Asthma   . Depression   . MVP (mitral valve prolapse)     Patient Active Problem List   Diagnosis Date Noted  . Excessive ear wax 04/17/2018  . Abdominal pain 03/14/2018  . Diarrhea 01/10/2018  . Preventative health care 05/13/2016  . Ringworm 05/13/2016  . 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 Generalized anxiety disorder 12/30/2009  . SCOLIOSIS 10/04/2007  . MDD (major depressive disorder) 07/23/2007  . ECZEMA 04/19/2007  . 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 nasal cautery  02/09   in OR  . NASAL SEPTUM SURGERY  7/13   [facility]--Dr Evans  . ORIF RADIAL FRACTURE  03/09   Left proximal   . TONSILLECTOMY AND ADENOIDECTOMY  2007        Home Medications    Prior to Admission medications   Medication Sig Start Date End Date Taking? Authorizing Provider  cholecalciferol (VITAMIN D) 1000 UNITS tablet Take 1,000 Units by mouth daily.    [provider]  diazepam (VALIUM) 5 MG tablet Take 2.5-5 mg by mouth See admin instructions. 2.5mg  in the morning and 5mg  at bedtime    [provider]  Multiple Vitamin (MULTIVITAMIN WITH MINERALS) TABS Take 1 tablet by mouth daily.    [provider]    Family History Family History  Problem Relation Age of Onset  . Asthma Mother   . GER disease Mother   . Eczema Mother   . Allergies Mother   . Alcohol abuse Maternal Grandfather        ? bipolar  . Cancer Paternal Grandfather        LIVER    Social History Social History   Tobacco Use  . Smoking status: Never Smoker  . Smokeless tobacco: Never Used  Substance Use Topics  . Alcohol use: No    Alcohol/week: 0.0 standard drinks  . Drug use: No     Allergies   Lexapro [escitalopram]; Remeron [mirtazapine]; Geodon [ziprasidone]; Other; Seroquel [quetiapine fumarate];  and Aripiprazole   Review of Systems Review of Systems  Constitutional: Negative for chills and fever.  Eyes: Negative for visual disturbance.  Respiratory: Negative for chest tightness and shortness of breath.   Cardiovascular: Positive for palpitations. Negative for chest pain.  Gastrointestinal: Negative for abdominal pain, nausea and vomiting.  Neurological: Negative for seizures, weakness, numbness and headaches.  Psychiatric/Behavioral: Negative for agitation, confusion, decreased concentration, self-injury and suicidal ideas. The patient is nervous/anxious.   All other systems reviewed and are  negative.    Physical Exam Updated Vital Signs BP (!) 147/79 (BP Location: Right Arm)   Pulse 89   Temp (!) 97.5 F (36.4 C) (Oral)   Resp 18   Ht 6\' 2"  (1.88 m)   Wt 72.6 kg   SpO2 100%   BMI 20.54 kg/m   Physical Exam  Constitutional: He appears well-developed and well-nourished. No distress.  HENT:  Head: Normocephalic and atraumatic.  Mouth/Throat: Oropharynx is clear and moist.  Eyes: Pupils are equal, round, and reactive to light. Conjunctivae and EOM are normal.  Pupils 4 mm bilaterally.  Neck: Normal range of motion. Neck supple.  Cardiovascular: Normal rate, regular rhythm, S1 normal and S2 normal.  No murmur heard. Pulmonary/Chest: Effort normal and breath sounds normal. He has no wheezes. He has no rales.  Abdominal: Soft. He exhibits no distension. There is no tenderness. There is no guarding.  Musculoskeletal: Normal range of motion. He exhibits no edema or deformity.  Neurological: He is alert.  Cranial nerves grossly intact. Strength 5 out of 5 in upper and lower extremities.  Gait is normal and symmetric.  Skin: Skin is warm and dry. No rash noted. No erythema.  Psychiatric: He has a normal mood and affect. His behavior is normal. Judgment and thought content normal.  Nursing note and vitals reviewed.    ED Treatments / Results  Labs (all labs ordered are listed, but only abnormal results are displayed) Labs Reviewed  CBC  BASIC METABOLIC PANEL    EKG None  Radiology No results found.  Procedures Procedures (including critical care time)  Medications Ordered in ED Medications - No data to display   Initial Impression / Assessment and Plan / ED Course  I have reviewed the triage vital signs and the nursing notes.  Pertinent labs & imaging results that were available during my care of the patient were reviewed by me and considered in my medical decision making (see chart for details).     Patient is nontoxic-appearing and neurologically  intact.  Based on patient's history, I am doubtful of seizure-like activity today.  Patient has not had any other neurologic symptoms.  Additionally, is not having any chest pain, shortness of breath, or concerning symptoms for cardiopulmonary cause of his symptoms.  I discussed with patient that the medications he is on cannot be stopped immediately, must be tapered.  Given that patient has been on Lexapro for 3 days only, patient informed that he may stop. This was discussed with Dr. Vanetta Mulders.  Patient informed that he SHOULD NOT adjust the valium, as doing so could precipitate seizures and he should call his psychiatrist on Monday.   I also discussed with the patient that his EKG demonstrated possible early re-pole pattern.  This should be evaluated by cardiology.  This is a supervised visit with Dr. Vanetta Mulders. Evaluation, management, and discharge planning discussed with this attending physician.  Final Clinical Impressions(s) / ED Diagnoses   Final diagnoses:  Acute stress reaction  Adverse effect of drug, initial encounter  Abnormal EKG    ED Discharge Orders    None       Delia Chimes 04/27/18 2236    Vanetta Mulders, MD 05/06/18 [phone]

## 2018-04-27 NOTE — ED Notes (Signed)
Pt states that he spoke with his doctor on the phone who told him that he was having a idiosyncratic reaction to his medication and he is feeling much better and would like to be discharged.  Pt is calm at this time.  Will check about pt being seen in pod A

## 2018-04-27 NOTE — ED Notes (Signed)
Pt arrives ambulatory into ED. He states "I need help".  Helped pt to a wheelchair and to check in and then back to triage.  Pt appears very anxious and states that he feels that he is having a adverse reaction to lexapro.  Pt is jittery. No sob, no swelling or throat or face.

## 2018-04-30 ENCOUNTER — Ambulatory Visit: Payer: Managed Care, Other (non HMO) | Admitting: Gastroenterology

## 2018-04-30 ENCOUNTER — Encounter: Payer: Self-pay | Admitting: Gastroenterology

## 2018-04-30 VITALS — BP 110/70 | HR 84 | Ht 72.25 in | Wt 158.1 lb

## 2018-04-30 DIAGNOSIS — K5909 Other constipation: Secondary | ICD-10-CM | POA: Diagnosis not present

## 2018-04-30 DIAGNOSIS — K625 Hemorrhage of anus and rectum: Secondary | ICD-10-CM

## 2018-04-30 NOTE — Progress Notes (Signed)
[facility] Consult Note:  History: Charles Copeland 04/30/2018  Referring physician: [name], MD  Reason for consult/chief complaint: Abdominal Pain (generalized); Constipation (alternating with diarrhea); Diarrhea; and Anxiety (withdrawals from valium)   Subjective  HPI:  This is a pleasant 22 year old man referred by primary care for abdominal pain and constipation.  It was somewhat challenging getting a consistent history due to his communication disorder.  It seems he has had at least 6 months of constipation with some lower abdominal pain around the time of bowel movements.  There has occasionally been some loose stool with urgency as well.  There is also been some rectal bleeding with bowel movements which ,at times, seems like a lot to him.  MiraLAX has apparently not been helpful.   He describes ongoing struggles with anxiety disorder and panic attacks and has had medication changes with multiple visits to the emergency department.  He saw his psychiatrist at [facility] last week.  However, since then he has been back in the emergency department and his newly prescribed Lexapro was stopped.  He feels that his lower digestive symptoms are possibly related to his medicines, since when he is taking Valium, his bowel habits are regular and there is no pain or bleeding.  However, he feels he needs to be off the Valium because it is causing some other side effects. Charles Copeland reports that his appetite is good and weight stable.  There has apparently been some concern over the symptoms because his brother has Crohn's disease.  ROS:  Review of Systems  Constitutional: Negative for appetite change and unexpected weight change.  HENT: Negative for mouth sores and voice change.   Eyes: Negative for pain and redness.  Respiratory: Negative for cough and shortness of breath.   Cardiovascular: Positive for palpitations. Negative for chest pain.  Genitourinary: Negative for  dysuria and hematuria.  Musculoskeletal: Negative for arthralgias and myalgias.  Skin: Negative for pallor and rash.  Neurological: Negative for weakness and headaches.  Hematological: Negative for adenopathy.  Psychiatric/Behavioral: The patient is nervous/anxious.      Past Medical History: Past Medical History:  Diagnosis Date  . Allergy   . Anxiety   . Asperger's syndrome   . Asthma   . Depression   . GERD (gastroesophageal reflux disease)   . MVP (mitral valve prolapse)   . Pectus excavatum   . Syncope      Past Surgical History: Past Surgical History:  Procedure Laterality Date  . nasal cautery  02/09   in OR  . NASAL SEPTUM SURGERY  7/13   [facility]--Dr Evans  . TONSILLECTOMY AND ADENOIDECTOMY  2007     Family History: Family History  Problem Relation Age of Onset  . Asthma Mother   . GER disease Mother   . Eczema Mother   . Allergies Mother   . Colon polyps Mother   . Crohn's disease Brother   . Mitral valve prolapse Brother   . Anxiety disorder Brother   . Alcohol abuse Maternal Grandfather        ? bipolar  . Liver cancer Paternal Grandfather        LIVER  . Sarcoidosis Maternal Uncle   . Hepatitis Maternal Aunt        autoimmune  . Graves' disease Maternal Aunt     Social History: Social History   Socioeconomic History  . Marital status: Single    Spouse name: Not on file  . Number of children: 0  .  Years of education: Not on file  . Highest education level: Not on file  Occupational History  . Occupation: Consulting civil engineertudent --[employer]  Social Needs  . Financial resource strain: Not on file  . Food insecurity:    Worry: Not on file    Inability: Not on file  . Transportation needs:    Medical: Not on file    Non-medical: Not on file  Tobacco Use  . Smoking status: Never Smoker  . Smokeless tobacco: Never Used  Substance and Sexual Activity  . Alcohol use: No    Alcohol/week: 0.0 standard drinks  . Drug use: No  . Sexual activity: Not on file   Lifestyle  . Physical activity:    Days per week: Not on file    Minutes per session: Not on file  . Stress: Not on file  Relationships  . Social connections:    Talks on phone: Not on file    Gets together: Not on file    Attends religious service: Not on file    Active member of club or organization: Not on file    Attends meetings of clubs or organizations: Not on file    Relationship status: Not on file  Other Topics Concern  . Not on file  Social History Narrative   Mother: retired Optometristediatrician   Father: Lab director-[employer]   One brother   No smokers in house   He has recently taken a leave from studies a [employer], where he was studying programming.  Allergies: Allergies  Allergen Reactions  . Lexapro [Escitalopram] Other (See Comments)    Hypotension, syncope  . Remeron [Mirtazapine] Other (See Comments)    Hypotension, passed out  . Geodon [Ziprasidone] Other (See Comments)    Syncope, Hypotension  . Other Hives    Lays Potato Chips  . Seroquel [Quetiapine Fumarate] Swelling    Excessive appetite  Stiff zombie  . Aripiprazole Other (See Comments)    tics    Outpatient Meds: Current Outpatient Medications  Medication Sig Dispense Refill  . cholecalciferol (VITAMIN D) 1000 UNITS tablet Take 1,000 Units by mouth daily.    . diazepam (VALIUM) 5 MG tablet Take 2.5-5 mg by mouth See admin instructions. 2.5mg  in the morning and 5mg  at bedtime    . Multiple Vitamin (MULTIVITAMIN WITH MINERALS) TABS Take 1 tablet by mouth daily.     No current facility-administered medications for this visit.       ___________________________________________________________________ Objective   Exam:  BP 110/70 (BP Location: Left Arm, Patient Position: Sitting, Cuff Size: Normal)   Pulse 84   Ht 6' 0.25" (1.835 m) Comment: height measured without shoes  Wt 158 lb 2 oz (71.7 kg)   BMI 21.30 kg/m    General: this is a(n) well-appearing, pleasant, conversational, variable  eye contact  Eyes: sclera anicteric, no redness  ENT: oral mucosa moist without lesions, no cervical or supraclavicular lymphadenopathy, good dentition  CV: RRR without murmur, S1/S2, no JVD, no peripheral edema  Resp: clear to auscultation bilaterally, normal RR and effort noted  GI: soft, no tenderness, with active bowel sounds. No guarding or palpable organomegaly noted.  Skin; warm and dry, no rash or jaundice noted  Neuro: awake, alert and oriented x 3. Normal gross motor function and fluent speech Rectal: Normal external, no fissure or tenderness or palpable internal lesions.  No stool in the rectal vault.  He seemed to have difficulty understanding instructions to test voluntary sphincter squeeze and puborectalis function or to perform  Valsalva.  He would usually do the opposite.  Labs:  CBC Latest Ref Rng & Units 09/16/2017 05/21/2017 04/15/2017  WBC 4.0 - 10.5 K/uL 7.6 7.4 7.8  Hemoglobin 13.0 - 17.0 g/dL 16.1 09.6 04.5  Hematocrit 39.0 - 52.0 % 41.5 42.8 41.9  Platelets 150 - 400 K/uL 209 235 202   CMP Latest Ref Rng & Units 09/16/2017 05/21/2017 04/15/2017  Glucose 65 - 99 mg/dL 98 91 409(W)  BUN 6 - 20 mg/dL 18 18 15   Creatinine 0.61 - 1.24 mg/dL 1.19 1.47 8.29  Sodium 135 - 145 mmol/L 137 138 140  Potassium 3.5 - 5.1 mmol/L 4.0 3.3(L) 4.0  Chloride 101 - 111 mmol/L 105 104 103  CO2 22 - 32 mmol/L 23 25 28   Calcium 8.9 - 10.3 mg/dL 9.5 9.7 9.6  Total Protein 6.5 - 8.1 g/dL 7.3 - -  Total Bilirubin 0.3 - 1.2 mg/dL 0.8 - -  Alkaline Phos 38 - 126 U/L 40 - -  AST 15 - 41 U/L 25 - -  ALT 17 - 63 U/L 15(L) - -     Assessment: Encounter Diagnoses  Name Primary?  . Chronic constipation Yes  . 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  Crohn's disease seems much less likely, as does neoplasia.  Due to the duration of symptoms, his and his family's concern over it, and the family  history of Crohn's disease, I recommended he consider a colonoscopy to rule out other causes.  He was agreeable, but quite anxious about undergoing the prep, having an IV placed and concerns that any of this might precipitate a panic attack or syncope or "seizure".  I explained the procedure in the preparation in detail in an effort to reassure him.  He is not sure how this might fit into his current plans to try and wean off Valium or what other medicines he might beyond, since he has not yet followed up with primary care or psychiatry since the last ED visit 3 days ago.  Plan:  He would like to give this more consideration, discuss his medication plan with primary care and/or psychiatry, and hopefully get back to [place] soon.  We gave him contact information and written materials related to colonoscopy.  Thank you for the courtesy of this consult.  Please call me with any questions or concerns.  Charlie Pitter III  CC: [name], MD

## 2018-04-30 NOTE — Patient Instructions (Signed)
If you are age 23 or older, your body mass index should be between 23-30. Your Body mass index is 21.3 kg/m. If this is out of the aforementioned range listed, please consider follow up with your Primary Care Provider.  If you are age 23 or younger, your body mass index should be between 19-25. Your Body mass index is 21.3 kg/m. If this is out of the aformentioned range listed, please consider follow up with your Primary Care Provider.   It has been recommended to you by your physician that you have a(n) colonoscopy completed. Per your request, we did not schedule the procedure(s) today. Please contact our office at [phone] should you decide to have the procedure completed.  It was a pleasure to see you today!  Dr. Myrtie Neitheranis

## 2018-05-01 ENCOUNTER — Ambulatory Visit: Payer: Managed Care, Other (non HMO) | Admitting: Internal Medicine

## 2018-05-01 ENCOUNTER — Encounter: Payer: Self-pay | Admitting: Internal Medicine

## 2018-05-01 VITALS — BP 102/70 | HR 67 | Temp 98.1°F | Ht 74.0 in | Wt 157.0 lb

## 2018-05-01 DIAGNOSIS — F411 Generalized anxiety disorder: Secondary | ICD-10-CM | POA: Diagnosis not present

## 2018-05-01 NOTE — Assessment & Plan Note (Signed)
This is still an issue---but doesn't feel the meds have helped Currently only on 2.5mg  diazepam daily---okay to just stop He doesn't think he can just take it prn---will start using regularly again Not clear that he was better --even on intensive Rx (antipsychotics, etc)

## 2018-05-01 NOTE — Patient Instructions (Signed)
It would be okay for you to go completely off the valium and see how you do.

## 2018-05-01 NOTE — Progress Notes (Signed)
Subjective:    Patient ID: Charles Copeland, male    DOB: 07/18/1996, 22 y.o.   MRN: [id number]  HPI Here for ER follow up  Had sensation that he was headed for a seizure Didn't have "the fully blown" spell Not like syncope--- "when I wake up from a seizure, I am screaming"  Wasn't feeling good on diazepam Went to Dr [facility]---saw NP Put back on lexapro---took it for 3 days and got bad panic attack Stopped lexapro--- did feel "tiny bit" of withdrawal  Taking 5mg  valium once at night Has cut it back to 2.5mg  Slept better last night  Current Outpatient Medications on File Prior to Visit  Medication Sig Dispense Refill  . cholecalciferol (VITAMIN D) 1000 UNITS tablet Take 1,000 Units by mouth daily.    . diazepam (VALIUM) 5 MG tablet Take 2.5-5 mg by mouth See admin instructions. 2.5mg  in the morning and 5mg  at bedtime    . Multiple Vitamin (MULTIVITAMIN WITH MINERALS) TABS Take 1 tablet by mouth daily.     No current facility-administered medications on file prior to visit.     Allergies  Allergen Reactions  . Lexapro [Escitalopram] Other (See Comments)    Hypotension, syncope  . Remeron [Mirtazapine] Other (See Comments)    Hypotension, passed out  . Geodon [Ziprasidone] Other (See Comments)    Syncope, Hypotension  . Other Hives    Lays Potato Chips  . Seroquel [Quetiapine Fumarate] Swelling    Excessive appetite  Stiff zombie  . Aripiprazole Other (See Comments)    tics    Past Medical History:  Diagnosis Date  . Allergy   . Anxiety   . Asperger's syndrome   . Asthma   . Depression   . GERD (gastroesophageal reflux disease)   . MVP (mitral valve prolapse)   . Pectus excavatum   . Syncope     Past Surgical History:  Procedure Laterality Date  . nasal cautery  02/09   in OR  . NASAL SEPTUM SURGERY  7/13   [facility]--Dr Evans  . TONSILLECTOMY AND ADENOIDECTOMY  2007    Family History  Problem Relation Age of Onset  . Asthma Mother   .  GER disease Mother   . Eczema Mother   . Allergies Mother   . Colon polyps Mother   . Crohn's disease Brother   . Mitral valve prolapse Brother   . Anxiety disorder Brother   . Alcohol abuse Maternal Grandfather        ? bipolar  . Liver cancer Paternal Grandfather        LIVER  . Sarcoidosis Maternal Uncle   . Hepatitis Maternal Aunt        autoimmune  . Graves' disease Maternal Aunt     Social History   Socioeconomic History  . Marital status: Single    Spouse name: Not on file  . Number of children: 0  . Years of education: Not on file  . Highest education level: Not on file  Occupational History  . Occupation: Consulting civil engineertudent --[employer]  Social Needs  . Financial resource strain: Not on file  . Food insecurity:    Worry: Not on file    Inability: Not on file  . Transportation needs:    Medical: Not on file    Non-medical: Not on file  Tobacco Use  . Smoking status: Never Smoker  . Smokeless tobacco: Never Used  Substance and Sexual Activity  . Alcohol use: No  Alcohol/week: 0.0 standard drinks  . Drug use: No  . Sexual activity: Not on file  Lifestyle  . Physical activity:    Days per week: Not on file    Minutes per session: Not on file  . Stress: Not on file  Relationships  . Social connections:    Talks on phone: Not on file    Gets together: Not on file    Attends religious service: Not on file    Active member of club or organization: Not on file    Attends meetings of clubs or organizations: Not on file    Relationship status: Not on file  . Intimate partner violence:    Fear of current or ex partner: Not on file    Emotionally abused: Not on file    Physically abused: Not on file    Forced sexual activity: Not on file  Other Topics Concern  . Not on file  Social History Narrative   Mother: retired Optometrist   Father: Lab director-[employer]   One brother   No smokers in house   Review of Systems Constipation is better Notes his "heart rate up  randomly"----does have anxiety (he lies flat and relaxes and it will settle down). 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  Psychiatric:  Usual mild anxiety and slightly pressured speech No clear depression No thought process abnormalities evident           Assessment & Plan:

## 2018-05-04 ENCOUNTER — Emergency Department (HOSPITAL_COMMUNITY)
Admission: EM | Admit: 2018-05-04 | Discharge: 2018-05-04 | Disposition: A | Discharge status: Home / Self Care | Payer: Managed Care, Other (non HMO) | Attending: Emergency Medicine | Admitting: Emergency Medicine

## 2018-05-04 ENCOUNTER — Encounter: Payer: Self-pay | Admitting: Internal Medicine

## 2018-05-04 ENCOUNTER — Ambulatory Visit: Payer: Managed Care, Other (non HMO) | Admitting: Internal Medicine

## 2018-05-04 ENCOUNTER — Other Ambulatory Visit: Payer: Self-pay

## 2018-05-04 VITALS — BP 120/82 | HR 71 | Temp 97.3°F | Ht 74.0 in | Wt 161.0 lb

## 2018-05-04 DIAGNOSIS — R197 Diarrhea, unspecified: Secondary | ICD-10-CM | POA: Diagnosis not present

## 2018-05-04 DIAGNOSIS — R002 Palpitations: Secondary | ICD-10-CM | POA: Diagnosis not present

## 2018-05-04 DIAGNOSIS — J45909 Unspecified asthma, uncomplicated: Secondary | ICD-10-CM | POA: Diagnosis not present

## 2018-05-04 DIAGNOSIS — R42 Dizziness and giddiness: Secondary | ICD-10-CM | POA: Diagnosis not present

## 2018-05-04 DIAGNOSIS — F411 Generalized anxiety disorder: Secondary | ICD-10-CM | POA: Diagnosis not present

## 2018-05-04 DIAGNOSIS — R55 Syncope and collapse: Secondary | ICD-10-CM | POA: Diagnosis not present

## 2018-05-04 DIAGNOSIS — R110 Nausea: Secondary | ICD-10-CM | POA: Diagnosis not present

## 2018-05-04 DIAGNOSIS — Z79899 Other long term (current) drug therapy: Secondary | ICD-10-CM | POA: Insufficient documentation

## 2018-05-04 LAB — BASIC METABOLIC PANEL
ANION GAP: 10 (ref 5–15)
BUN: 15 mg/dL (ref 6–20)
CHLORIDE: 106 mmol/L (ref 98–111)
CO2: 25 mmol/L (ref 22–32)
CREATININE: 0.79 mg/dL (ref 0.61–1.24)
Calcium: 9.8 mg/dL (ref 8.9–10.3)
GFR calc non Af Amer: >60 mL/min (ref >60)
Glucose, Bld: 107 mg/dL — ABNORMAL HIGH (ref 70–99)
Potassium: 3.8 mmol/L (ref 3.5–5.1)
Sodium: 141 mmol/L (ref 135–145)

## 2018-05-04 LAB — URINALYSIS, ROUTINE W REFLEX MICROSCOPIC
BACTERIA UA: NONE SEEN
Bilirubin Urine: NEGATIVE
Glucose, UA: NEGATIVE mg/dL
Hgb urine dipstick: NEGATIVE
Ketones, ur: NEGATIVE mg/dL
Leukocytes, UA: NEGATIVE
Nitrite: NEGATIVE
PROTEIN: NEGATIVE mg/dL
Specific Gravity, Urine: 1.01 (ref 1.005–1.030)
pH: 5 (ref 5.0–8.0)

## 2018-05-04 LAB — CBC
HEMATOCRIT: 44 % (ref 39.0–52.0)
HEMOGLOBIN: 14.5 g/dL (ref 13.0–17.0)
MCH: 30.6 pg (ref 26.0–34.0)
MCHC: 33 g/dL (ref 30.0–36.0)
MCV: 92.8 fL (ref 78.0–100.0)
Platelets: 181 10*3/uL (ref 150–400)
RBC: 4.74 MIL/uL (ref 4.22–5.81)
RDW: 12.2 % (ref 11.5–15.5)
WBC: 7.4 10*3/uL (ref 4.0–10.5)

## 2018-05-04 MED ORDER — SODIUM CHLORIDE 0.9 % IV BOLUS (SEPSIS)
1000.0000 mL | Freq: Once | INTRAVENOUS | Status: AC
  Administered 2018-05-04: 1000 mL via INTRAVENOUS

## 2018-05-04 NOTE — ED Notes (Signed)
Pt states he has called his mom to come and pick him up. States "I think I know what is wrong"  Requesting water.  Making calls and continuing to pace.  Asked to have a seat.

## 2018-05-04 NOTE — ED Notes (Signed)
Patient refused blood stick in triage, states that he is going to the lobby to drink bottles of water.

## 2018-05-04 NOTE — Progress Notes (Signed)
Subjective:    Patient ID: Charles Copeland, male    DOB: 01/02/1996, 22 y.o.   MRN: [id number]  HPI Here for ER follow up  Off the valium---"my heart is pounding like crazy" He really thinks this is MVP--though echo in 2013 did not show MVP He thinks his panic is related to this---like his brother  Current Outpatient Medications on File Prior to Visit  Medication Sig Dispense Refill  . cholecalciferol (VITAMIN D) 1000 UNITS tablet Take 1,000 Units by mouth daily.    . Multiple Vitamin (MULTIVITAMIN WITH MINERALS) TABS Take 1 tablet by mouth daily.     No current facility-administered medications on file prior to visit.     Allergies  Allergen Reactions  . Lexapro [Escitalopram] Other (See Comments)    Hypotension, syncope  . Remeron [Mirtazapine] Other (See Comments)    Hypotension, passed out  . Geodon [Ziprasidone] Other (See Comments)    Syncope, Hypotension  . Other Hives    Lays Potato Chips  . Seroquel [Quetiapine Fumarate] Swelling    Excessive appetite  Stiff zombie  . Aripiprazole Other (See Comments)    tics    Past Medical History:  Diagnosis Date  . Allergy   . Anxiety   . Asperger's syndrome   . Asthma   . Depression   . GERD (gastroesophageal reflux disease)   . MVP (mitral valve prolapse)   . Pectus excavatum   . Syncope     Past Surgical History:  Procedure Laterality Date  . nasal cautery  02/09   in OR  . NASAL SEPTUM SURGERY  7/13   [facility]--Dr Evans  . TONSILLECTOMY AND ADENOIDECTOMY  2007    Family History  Problem Relation Age of Onset  . Asthma Mother   . GER disease Mother   . Eczema Mother   . Allergies Mother   . Colon polyps Mother   . Crohn's disease Brother   . Mitral valve prolapse Brother   . Anxiety disorder Brother   . Alcohol abuse Maternal Grandfather        ? bipolar  . Liver cancer Paternal Grandfather        LIVER  . Sarcoidosis Maternal Uncle   . Hepatitis Maternal Aunt        autoimmune  . Graves'  disease Maternal Aunt     Social History   Socioeconomic History  . Marital status: Single    Spouse name: Not on file  . Number of children: 0  . Years of education: Not on file  . Highest education level: Not on file  Occupational History  . 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 Financial resource strain: Not on file  . Food insecurity:    Worry: Not on file    Inability: Not on file  . Transportation needs:    Medical: Not on file    Non-medical: Not on file  Tobacco Use  . Smoking status: Never Smoker  . Smokeless tobacco: Never Used  Substance and Sexual Activity  . Alcohol use: No    Alcohol/week: 0.0 standard drinks  . Drug use: No  . Sexual activity: Not on file  Lifestyle  . Physical activity:    Days per week: Not on file    Minutes per session: Not on file  . Stress: Not on file  Relationships  . Social connections:    Talks on phone: Not on file    Gets together: Not on file  Attends religious service: Not on file    Active member of club or organization: Not on file    Attends meetings of clubs or organizations: Not on file    Relationship status: Not on file  . Intimate partner violence:    Fear of current or ex partner: Not on file    Emotionally abused: Not on file    Physically abused: Not on file    Forced sexual activity: Not on file  Other Topics Concern  . Not on file  Social History Narrative   Mother: retired Optometristediatrician   Father: Lab director-[employer]   One brother   No smokers in house   Review of Systems More trouble sleeping Appetite is the same Maternal GF is dying--mom not handling this well. Dad doesn't handle the stress well either (per him)    Objective:   Physical Exam  Neck: No thyromegaly present.  Cardiovascular: Normal rate, regular rhythm and normal heart sounds. Exam reveals no gallop and no friction rub.  No murmur heard. No click or murmur  Respiratory: Effort normal and breath sounds normal. No  respiratory distress. He has no wheezes. He has no rales.  Lymphadenopathy:    He has no cervical adenopathy.           Assessment & Plan:

## 2018-05-04 NOTE — Discharge Instructions (Signed)

## 2018-05-04 NOTE — ED Notes (Signed)
Pt states his mom is not coming to get him at this time and he will stay to be seen for now. Continues to pace.

## 2018-05-04 NOTE — ED Triage Notes (Addendum)
Patient states that he got dizzy and states "I may have had syncope, I lost some time". Patient repeatedly asks for water. States that he has had multiple bouts of diarrhea. When asked if he has seizures he states "well, I have not been diagnosed with them, but I do have tonic-clonic actions".

## 2018-05-04 NOTE — Assessment & Plan Note (Signed)
Worse off the valium--but he still feels he has reacted to this Relates to MVP but no PE findings and 2013 echo did not show MVP No insight into anxiety as cause for this

## 2018-05-04 NOTE — Assessment & Plan Note (Signed)
He is resistant to taking meds for this Thinks his problems is the heart and wants to see cardiology Discussed the normal echo I think he needs other daily med (SNRI??) Will set up cardiology--just in case Urged him to at least restart the valium at bedtime for now

## 2018-05-04 NOTE — ED Notes (Addendum)
Pt very anxious, states that he is autistic, has had multiple episodes of diarrhea today, pt states that he recently stopped taking all of his medication including his anxiety medications. Pt feels he his dehydrated and states that he has could have a seizure when he gets stuck with a needle, possible pseudo seizures. Pt denies SI/HI, reports that he has been talking to his therapist which helps him a lot.

## 2018-05-04 NOTE — ED Notes (Signed)
Ambulated pt while monitoring HR. HR was 68 when we first ambulated and went up to 98 while ambulating. Tolerated well. Pt back in bed and will continue monitoring.

## 2018-05-04 NOTE — ED Provider Notes (Signed)
[facility] EMERGENCY DEPARTMENT Provider Note   CSN: [id number] Arrival date & time: 05/04/18  0111     History   Chief Complaint Chief Complaint  Patient presents with  . Dizziness    HPI Charles Copeland is a 22 y.o. male.  The history is provided by the patient.  Dizziness  Severity:  Moderate Onset quality:  Gradual Timing:  Constant Progression:  Improving Chronicity:  Recurrent Relieved by:  None tried Worsened by:  Nothing Associated symptoms: diarrhea, nausea and syncope   Associated symptoms: no chest pain    Pt with h/o anxiety, Asperger's, presents for multiple complaints He reports since he is stopped taking his Valium he is been feeling very anxious.  He reports feeling very dizzy at times.  No recent seizures but may have had syncope. No chest pain.  No headache.  No focal weakness.  Reports recent constipation that is then progressed into diarrhea. Reports he has not had Valium in several days.  He has been followed by his primary care for this. Past Medical History:  Diagnosis Date  . Allergy   . Anxiety   . Asperger's syndrome   . Asthma   . Depression   . GERD (gastroesophageal reflux disease)   . MVP (mitral valve prolapse)   . Pectus excavatum   . Syncope     Patient Active Problem List   Diagnosis Date Noted  . Excessive ear wax 04/17/2018  . Abdominal pain 03/14/2018  . Diarrhea 01/10/2018  . Preventative health care 05/13/2016  . Ringworm 05/13/2016  . 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 Generalized anxiety disorder 12/30/2009  . SCOLIOSIS 10/04/2007  . MDD (major depressive disorder) 07/23/2007  . ECZEMA 04/19/2007  . 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 nasal cautery  02/09   in OR  . NASAL SEPTUM SURGERY  7/13   [facility]--Dr Evans  . TONSILLECTOMY AND ADENOIDECTOMY  2007        Home Medications    Prior to Admission medications    Medication Sig Start Date End Date Taking? Authorizing Provider  cholecalciferol (VITAMIN D) 1000 UNITS tablet Take 1,000 Units by mouth daily.   Yes [provider]  Multiple Vitamin (MULTIVITAMIN WITH MINERALS) TABS Take 1 tablet by mouth daily.   Yes [provider]    Family History Family History  Problem Relation Age of Onset  . Asthma Mother   . GER disease Mother   . Eczema Mother   . Allergies Mother   . Colon polyps Mother   . Crohn's disease Brother   . Mitral valve prolapse Brother   . Anxiety disorder Brother   . Alcohol abuse Maternal Grandfather        ? bipolar  . Liver cancer Paternal Grandfather        LIVER  . Sarcoidosis Maternal Uncle   . Hepatitis Maternal Aunt        autoimmune  . Graves' disease Maternal Aunt     Social History Social History   Tobacco Use  . Smoking status: Never Smoker  . Smokeless tobacco: Never Used  Substance Use Topics  . Alcohol use: No    Alcohol/week: 0.0 standard drinks  . Drug use: No     Allergies   Lexapro [escitalopram]; Remeron [mirtazapine]; Geodon [ziprasidone]; Other; Seroquel [quetiapine fumarate]; and Aripiprazole   Review of Systems Review of Systems  Constitutional: Negative for fever.  Cardiovascular: Positive  for syncope. Negative for chest pain.  Gastrointestinal: Positive for diarrhea and nausea.  Neurological: Positive for dizziness. Negative for seizures.  Psychiatric/Behavioral: Negative for suicidal ideas.  All other systems reviewed and are negative.    Physical Exam Updated Vital Signs BP 135/69   Pulse 86   Temp 99 F (37.2 C) (Oral)   Resp 14   Ht 1.854 m (6\' 1" )   Wt 72.6 kg   SpO2 98%   BMI 21.11 kg/m   Physical Exam CONSTITUTIONAL: Well developed/well nourished HEAD: Normocephalic/atraumatic EYES: EOMI/PERRL, no nystagmus, no ptosis ENMT: Mucous membranes moist NECK: supple no meningeal signs, no bruits SPINE/BACK:entire spine nontender CV: S1/S2  noted, no murmurs/rubs/gallops noted LUNGS: Lungs are clear to auscultation bilaterally, no apparent distress ABDOMEN: soft, nontender, no rebound or guarding GU:no cva tenderness NEURO:Awake/alert, face symmetric, no arm or leg drift is noted Equal 5/5 strength with shoulder abduction, elbow flex/extension, wrist flex/extension in upper extremities and equal hand grips bilaterally Equal 5/5 strength with hip flexion,knee flex/extension, foot dorsi/plantar flexion Cranial nerves 3/4/5/6/03/13/09/11/12 tested and intact No past pointing Sensation to light touch intact in all extremities EXTREMITIES: pulses normal, full ROM SKIN: warm, color normal PSYCH: patient has good eye contact, conversant  ED Treatments / Results  Labs (all labs ordered are listed, but only abnormal results are displayed) Labs Reviewed  BASIC METABOLIC PANEL - Abnormal; Notable for the following components:      Result Value   Glucose, Bld 107 (*)    All other components within normal limits  URINALYSIS, ROUTINE W REFLEX MICROSCOPIC - Abnormal; Notable for the following components:   Color, Urine STRAW (*)    All other components within normal limits  CBC    EKG EKG Interpretation  Date/Time:  Friday May 04 2018 03:54:03 EDT Ventricular Rate:  80 PR Interval:    QRS Duration: 101 QT Interval:  373 QTC Calculation: 431 R Axis:   79 Text Interpretation:  Sinus rhythm RSR' in V1 or V2, probably normal variant ST elev, probable normal early repol pattern No significant change since last tracing Confirmed by Zadie Rhine ([id number]) on 05/04/2018 4:34:15 AM   Radiology No results found.  Procedures Procedures    Medications Ordered in ED Medications  sodium chloride 0.9 % bolus 1,000 mL (1,000 mLs Intravenous New Bag/Given 05/04/18 0620)  sodium chloride 0.9 % bolus 1,000 mL (1,000 mLs Intravenous New Bag/Given 05/04/18 0620)     Initial Impression / Assessment and Plan / ED Course  I have  reviewed the triage vital signs and the nursing notes.  Pertinent labs  results that were available during my care of the patient were reviewed by me and considered in my medical decision making (see chart for details).     Patient with multiple complaints, he is mostly concerned about his dizziness.  Suspect this is due to recent medication changes.  It has been several days since he stopped Valium, therefore withdrawal seizure is unlikely.  He has no focal neuro deficits.  He is mildly tachycardic upon standing, will give him IV fluids and reassess 7:34 AM Patient improved.  He is resting comfortably.  His heart rate and blood pressure normalized.  He will be ambulated, and if he is able to ambulate he will be discharged home.  He will need to follow-up with his PCP as well as his psychiatrist. Final Clinical Impressions(s) / ED Diagnoses   Final diagnoses:  Lightheadedness    ED Discharge Orders    None  Zadie RhineWickline, Stephenie Navejas, MD 05/04/18 [phone]

## 2018-05-11 ENCOUNTER — Ambulatory Visit (INDEPENDENT_AMBULATORY_CARE_PROVIDER_SITE_OTHER): Payer: Managed Care, Other (non HMO)

## 2018-05-11 ENCOUNTER — Ambulatory Visit: Payer: Managed Care, Other (non HMO) | Admitting: Cardiovascular Disease

## 2018-05-11 ENCOUNTER — Encounter: Payer: Self-pay | Admitting: Cardiovascular Disease

## 2018-05-11 VITALS — BP 118/76 | HR 62 | Ht 77.0 in | Wt 158.0 lb

## 2018-05-11 DIAGNOSIS — R002 Palpitations: Secondary | ICD-10-CM

## 2018-05-11 DIAGNOSIS — I951 Orthostatic hypotension: Secondary | ICD-10-CM | POA: Diagnosis not present

## 2018-05-11 DIAGNOSIS — F339 Major depressive disorder, recurrent, unspecified: Secondary | ICD-10-CM

## 2018-05-11 DIAGNOSIS — Q676 Pectus excavatum: Secondary | ICD-10-CM | POA: Diagnosis not present

## 2018-05-11 DIAGNOSIS — F411 Generalized anxiety disorder: Secondary | ICD-10-CM

## 2018-05-11 MED ORDER — ATENOLOL 25 MG PO TABS: 25.0000 mg | ORAL_TABLET | Freq: Every day | ORAL | 6 refills | Status: DC | PRN

## 2018-05-11 NOTE — Progress Notes (Signed)
Cardiology Office Note  Date:  05/11/2018   ID:  Rolinda Roan, DOB 1996/05/13, MRN [id number]  PCP:  [name], MD   Chief Complaint  Patient presents with  . OTHER    Syncope, Heart palpitations/Dizziness. Meds reviewed verbally with pt.    HPI:  Charles Copeland is a 22 year old male with past medical history of GAD autism High functioning Asperger's Who presents by referral from Dr. Alphonsus Sias for consultation of his tachycardia and lightheadedness  Reports he has been maintained on Valium every evening for anxiety Valium weaned and has had increasing tachycardia and symptoms at nighttime Mom presents with him today and is concerned about mitral valve prolapse Concerned about tachycardia or problems Other family member treated with atenolol with improvement of his symptoms  Previous cardiac work-up including several echocardiograms Both of which were normal in 2000 and 2013 No mention of mitral valve prolapse  05/04/2018 In the ER Syncope, diarrhea, nausea, dizziness Was given fluids with improvement of his symptoms  Currently taking Valium 2.5 mg every evening  EKG personally reviewed by myself on todays visit Shows normal sinus rhythm rate 62 bpm no significant ST or T wave changes  Orthostatics performed: Blood pressure 127/80 heart rate 64 supine 127/78 heart rate 82 sitting 131/85 heart rate 102 standing, felt lightheaded 124/83 heart rate 96 after 3 minutes standing  Echo 2000  Two-dimensional sector scan and Doppler examination in this patient with    pectus excavatum show no structural abnormalities. The right ventricle    appears flattened; however, there is no evidence of outflow tract    obstruction. The left ventricular size and shortening are normal. There    is a normal left aortic arch with no coarctation. There is no evidence of    mitral valve prolapse.  Echo 2013: 1. Echocardiogram performed for this patient with clinical concern for   possible Connective Tissue Disorder, [place]. 2. Normal segmental anatomy with no structural defects. 3. Specifically, no aortic root dilation and no mitral valve prolapse. 4. All intracardiac valves appear normal with no significant stenosis or  regurgitation. 5. Normal coronary artery origins and proximal courses. 6. Normal biventricular sizes and systolic function. 7. Normal diastolic function. 8. NORMAL ECHOCARDIOGRAM.   PMH:   has a past medical history of Allergy, Anxiety, Asperger's syndrome, Asthma, Depression, GERD (gastroesophageal reflux disease), Mitral valve prolapse, Pectus excavatum, and Syncope.  PSH:    Past Surgical History:  Procedure Laterality Date  . nasal cautery  02/09   in OR  . NASAL SEPTUM SURGERY  7/13   [facility]--Dr Evans  . TONSILLECTOMY AND ADENOIDECTOMY  2007    Current Outpatient Medications  Medication Sig Dispense Refill  . cholecalciferol (VITAMIN D) 1000 UNITS tablet Take 1,000 Units by mouth daily.    . diazepam (VALIUM) 5 MG tablet Take 2.5 mg by mouth at bedtime.    . Multiple Vitamin (MULTIVITAMIN WITH MINERALS) TABS Take 1 tablet by mouth daily.    Marland Kitchen atenolol (TENORMIN) 25 MG tablet Take 1 tablet (25 mg total) by mouth daily as needed. 30 tablet 6   No current facility-administered medications for this visit.      Allergies:   Lexapro [escitalopram]; Remeron [mirtazapine]; Geodon [ziprasidone]; Other; Seroquel [quetiapine fumarate]; and Aripiprazole   Social History:  The patient  reports that he has never smoked. He has never used smokeless tobacco. He reports that he does not drink alcohol or use drugs.   Family History:   family history includes Alcohol  abuse in his maternal grandfather; Allergies in his mother; Anxiety disorder in his brother; Asthma in his mother; Colon polyps in his mother; Crohn's disease in his brother; Eczema in his mother; GER disease in his mother; Luiz Blare' disease in his maternal aunt; Hepatitis in his  maternal aunt; Hyperlipidemia in his father and mother; Liver cancer in his paternal grandfather; Mitral valve prolapse in his brother; Sarcoidosis in his maternal uncle.    Review of Systems: Review of Systems  Constitutional: Negative.   Respiratory: Negative.   Cardiovascular: Positive for palpitations.       Tachycardia  Gastrointestinal: Negative.   Musculoskeletal: Negative.   Neurological: Positive for dizziness.  Psychiatric/Behavioral: Negative.   All other systems reviewed and are negative.    PHYSICAL EXAM: VS:  BP 118/76 (BP Location: Right Arm, Patient Position: Sitting, Cuff Size: Normal)   Pulse 62   Ht 6\' 5"  (1.956 m)   Wt 158 lb (71.7 kg)   BMI 18.74 kg/m  , BMI Body mass index is 18.74 kg/m. GEN: Well nourished, well developed, in no acute distress  HEENT: normal  Neck: no JVD, carotid bruits, or masses Cardiac: RRR; no murmurs, rubs, or gallops,no edema  Respiratory:  clear to auscultation bilaterally, normal work of breathing GI: soft, nontender, nondistended, + BS MS: no deformity or atrophy  Skin: warm and dry, no rash Neuro:  Strength and sensation are intact Psych: euthymic mood, full affect   Recent Labs: 09/16/2017: ALT 15 05/04/2018: BUN 15; Creatinine, Ser 0.79; Hemoglobin 14.5; Platelets 181; Potassium 3.8; Sodium 141    Lipid Panel Lab Results  Component Value Date   CHOL 219 (H) 05/13/2016   HDL 45 05/13/2016   LDLCALC 150 (H) 05/13/2016   TRIG 121 05/13/2016      Wt Readings from Last 3 Encounters:  05/11/18 158 lb (71.7 kg)  05/04/18 161 lb (73 kg)  05/04/18 160 lb (72.6 kg)       ASSESSMENT AND PLAN:  Palpitations - Plan: EKG 12-Lead, LONG TERM MONITOR (3-14 DAYS) Tachycardia noted with change in position on today's visit Reports he is well-hydrated Unable to exclude autonomic dysfunction, POTS. Mother wondering if he needs a tilt table study Event monitor has been ordered For his tachycardia almost every night  recommended he try atenolol 12.5 mg at dinner Dose could be increased if blood pressure tolerates If no improvement in symptoms would recommend he meet with Dr. Graciela Husbands  Generalized anxiety disorder On Valium Mother reports anxiety is a major issue likely driving tachycardia at nighttime  Orthostasis No significant drop in heart rate but notable climbing heart rate 64 up to 102 with standing For the most part is asymptomatic in the daytime but does have anxiety Recommend he stay hydrated Monitor as detailed above  Pectus excavatum Previously seen by specialist who recommended no corrective surgery  Recurrent major depressive disorder, remission status unspecified (HCC) Managed by Dr. Alphonsus Sias Patient reports he is not depressed Mother reports he is high functioning Asperger's, autism, anxiety  Disposition:   F/U as needed   Total encounter time more than 60 minutes  Greater than 50% was spent in counseling and coordination of care with the patient  Patient was seen in consultation for Dr. Alphonsus Sias be referred back to his office for ongoing care of the issues detailed above    Orders Placed This Encounter  Procedures  . LONG TERM MONITOR (3-14 DAYS)  . EKG 12-Lead     Signed, Dossie Arbour, M.D., Ph.D. 05/11/2018  [place], [place]

## 2018-05-11 NOTE — Patient Instructions (Signed)
Medication Instructions:   Try atenolol 1/2 pill every night Wait until after the monitor  Labwork:  No new labs needed  Testing/Procedures:  Zio Patch 2 weeks monitor for tachycardia, syncope   Follow-Up: It was a pleasure seeing you in the office today. Please call us if you have new issues that need to be addressed before your next appt.  [phone]  Your physician wants you to follow-up in:  As needed  If you need a refill on your cardiac medications before your next appointment, please call your pharmacy.  For educational health videos Log in to : [email or web address] Or : [email or web address], password : [email or web address]

## 2018-06-01 ENCOUNTER — Telehealth: Payer: Self-pay | Admitting: Cardiovascular Disease

## 2018-06-01 NOTE — Telephone Encounter (Signed)
Attempted to contact patient, but there was no answer and VM full.  

## 2018-06-01 NOTE — Telephone Encounter (Signed)
Patient calling about monitor results  Please call back

## 2018-06-04 NOTE — Telephone Encounter (Signed)
S/w patient to let him know that report is in Dr Windell Hummingbird basket to be reviewed and that we will notify him as soon as it is resulted. Patient was appreciative.

## 2018-06-06 ENCOUNTER — Ambulatory Visit: Payer: Managed Care, Other (non HMO) | Admitting: Internal Medicine

## 2018-06-06 ENCOUNTER — Encounter: Payer: Self-pay | Admitting: Internal Medicine

## 2018-06-06 VITALS — BP 116/70 | HR 83 | Temp 98.3°F | Ht 77.0 in | Wt 157.0 lb

## 2018-06-06 DIAGNOSIS — Z23 Encounter for immunization: Secondary | ICD-10-CM | POA: Diagnosis not present

## 2018-06-06 DIAGNOSIS — R002 Palpitations: Secondary | ICD-10-CM

## 2018-06-06 DIAGNOSIS — F411 Generalized anxiety disorder: Secondary | ICD-10-CM

## 2018-06-06 NOTE — Progress Notes (Signed)
Subjective:    Patient ID: Charles Copeland, male    DOB: 1995/09/12, 22 y.o.   MRN: [id number]  HPI Here for follow up of palpitations and anxiety/mood issues  Saw Dr Mariah Milling Event monitor was not worrisome Didn't try the atenolol Still awakens at night---but palpitations are not as prominent  Sleep is some better Taking the diazepam 2.5mg  at bedtime again Melatonin now---this is reducing his frequent awakening  Doesn't feel his anxiety is worse "I don't know what is anxiety or not" Not having shaking spells since starting the melatonin  Avoiding walking outside in heat Really makes him dizzy gatorade may have helped but not comfortable using this often (affects his voiding)  No classes now 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 cholecalciferol (VITAMIN D) 1000 UNITS tablet Take 1,000 Units by mouth daily.    . diazepam (VALIUM) 5 MG tablet Take 2.5 mg by mouth at bedtime.    . Multiple Vitamin (MULTIVITAMIN WITH MINERALS) TABS Take 1 tablet by mouth daily.     No current facility-administered medications on file prior to visit.     Allergies  Allergen Reactions  . Lexapro [Escitalopram] Other (See Comments)    Hypotension, syncope  . Remeron [Mirtazapine] Other (See Comments)    Hypotension, passed out  . Geodon [Ziprasidone] Other (See Comments)    Syncope, Hypotension  . Other Hives    Lays Potato Chips  . Seroquel [Quetiapine Fumarate] Swelling    Excessive appetite  Stiff zombie  . Aripiprazole Other (See Comments)    tics    Past Medical History:  Diagnosis Date  . Allergy   . Anxiety   . Asperger's syndrome   . Asthma   . Depression   . GERD (gastroesophageal reflux disease)   . Mitral valve prolapse   . Pectus excavatum   . Syncope     Past Surgical History:  Procedure Laterality Date  . nasal cautery  02/09   in OR  . NASAL SEPTUM SURGERY  7/13   [facility]--Dr Evans  . TONSILLECTOMY AND ADENOIDECTOMY  2007    Family History  Problem Relation Age of Onset  . Asthma Mother   . GER disease Mother   . Eczema Mother   . Allergies Mother   . Colon polyps Mother   . Hyperlipidemia Mother   . Hyperlipidemia Father   . Crohn's disease Brother   . Mitral valve prolapse Brother   . Anxiety disorder Brother   . Alcohol abuse Maternal Grandfather        ? bipolar  . Liver cancer Paternal Grandfather        LIVER  . Sarcoidosis Maternal Uncle   . Hepatitis Maternal Aunt        autoimmune  . Graves' disease Maternal Aunt     Social History   Socioeconomic History  . Marital status: Single    Spouse name: Not on file  . Number of children: 0  . Years of education: Not on file  . Highest education level: Not on file  Occupational History  . 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 Financial resource strain: Not on file  . Food insecurity:    Worry: Not on file    Inability: Not on file  . Transportation needs:    Medical: Not on file    Non-medical: Not on file  Tobacco Use  . Smoking  status: Never Smoker  . Smokeless tobacco: Never Used  Substance and Sexual Activity  . Alcohol use: No    Alcohol/week: 0.0 standard drinks  . Drug use: No  . Sexual activity: Not on file  Lifestyle  . Physical activity:    Days per week: Not on file    Minutes per session: Not on file  . Stress: Not on file  Relationships  . Social connections:    Talks on phone: Not on file    Gets together: Not on file    Attends religious service: Not on file    Active member of club or organization: Not on file    Attends meetings of clubs or organizations: Not on file    Relationship status: Not on file  . Intimate partner violence:    Fear of current or ex partner: Not on file    Emotionally abused: Not on file    Physically abused: Not on file    Forced sexual activity: Not on file  Other Topics Concern  . Not on file  Social  History Narrative   Mother: retired Optometrist   Father: Lab director-[employer]   One brother   No smokers in house   Review of Systems Appetite is "pretty decent" Bowels have been yellow and green. Constipated at times Weight stable    Objective:   Physical Exam  Constitutional: No distress.  Neck: No thyromegaly present.  Cardiovascular: Normal rate, regular rhythm and normal heart sounds. Exam reveals no gallop.  No murmur heard. Respiratory: Effort normal and breath sounds normal. No respiratory distress. He has no wheezes. He has no rales.  Musculoskeletal: He exhibits no edema.  Lymphadenopathy:    He has no cervical adenopathy.  Psychiatric:  Not depressed Anxiety is not as evident           Assessment & Plan:

## 2018-06-06 NOTE — Assessment & Plan Note (Signed)
Nothing on the monitor Didn't take the atenolol Has been limiting himself Discussed slowly increasing his activity

## 2018-06-06 NOTE — Assessment & Plan Note (Signed)
Will continue the low dose diazepam at bedtime Using melatonin as well (not sure of dose) Plans to go back to school for programming

## 2018-06-07 NOTE — Telephone Encounter (Signed)
Patient calling to check on status.

## 2018-06-07 NOTE — Telephone Encounter (Addendum)
Spoke with patient and reviewed that results are pending physician review. He wanted to move forward with scheduling appointment with provider for further evaluation of possible "POTS" and additional testing. Patient reports that shaking spells have gotten better, and since starting melatonin he has also not been waking up as much during the night. He has been increasing his salt and fluid intake and that has helped some as well. Reviewed that I would route this message over to provider for review of status on monitor and to also inquire about referral to specialist about possible tilt table test and POTS evaluation and that I would call him back with that information. He verbalized understanding with no further questions at this time.

## 2018-06-11 ENCOUNTER — Telehealth: Payer: Self-pay | Admitting: *Deleted

## 2018-06-11 NOTE — Telephone Encounter (Signed)
Reviewed monitor results and recommendations by Dr. Mariah Milling to see Dr. Graciela Husbands for further evaluation of possible POTs and for discussion if tilt table test is needed. Advised that we have him scheduled to see Dr. Graciela Husbands on Thursday 06/14/18 at 11:30 AM and that we canceled his upcoming appointment with Dr. Mariah Milling. He verbalized understanding of our conversation, agreement with plan, and had no further questions at this time. Advised him to have his mother call if she has any questions as well.

## 2018-06-11 NOTE — Telephone Encounter (Signed)
-----   Message from Antonieta Iba, MD sent at 06/09/2018 10:37 PM EDT ----- We can refer to EP if he would like Monitor did not reveal significant arrhythmia when he felt dizzy/shaky/anxious

## 2018-06-11 NOTE — Telephone Encounter (Signed)
Duplicate entry. See other telephone note with results and additional information.

## 2018-06-11 NOTE — Telephone Encounter (Signed)
Patient calling to check on status of results Patient scheduled an appointment for 10/29 to see Dr. Mariah Milling

## 2018-06-11 NOTE — Telephone Encounter (Signed)
No answer/Voicemail box is not set up 

## 2018-06-14 ENCOUNTER — Encounter: Payer: Self-pay | Admitting: Internal Medicine

## 2018-06-14 ENCOUNTER — Ambulatory Visit: Payer: Managed Care, Other (non HMO) | Admitting: Internal Medicine

## 2018-06-14 VITALS — BP 120/76 | HR 64 | Ht 73.0 in | Wt 157.0 lb

## 2018-06-14 DIAGNOSIS — R002 Palpitations: Secondary | ICD-10-CM

## 2018-06-14 NOTE — Patient Instructions (Signed)
Medication Instructions:  - Your physician recommends that you continue on your current medications as directed. Please refer to the Current Medication list given to you today.  If you need a refill on your cardiac medications before your next appointment, please call your pharmacy.   Lab work: - none ordered  If you have labs (blood work) drawn today and your tests are completely normal, you will receive your results only by: . MyChart Message (if you have MyChart) OR . A paper copy in the mail If you have any lab test that is abnormal or we need to change your treatment, we will call you to review the results.  Testing/Procedures: - none ordered  Follow-Up: At [facility], you and your health needs are our priority.  As part of our continuing mission to provide you with exceptional heart care, we have created designated Provider Care Teams.  These Care Teams include your primary Cardiologist (physician) and Advanced Practice Providers (APPs -  Physician Assistants and Nurse Practitioners) who all work together to provide you with the care you need, when you need it. . as needed with Dr. Klein.  Any Other Special Instructions Will Be Listed Below (If Applicable). - N/A   

## 2018-06-14 NOTE — Progress Notes (Signed)
ELECTROPHYSIOLOGY CONSULT NOTE  Patient ID: CLAYBURN WEEKLY, MRN: [id number], DOB/AGE: 1996-04-27 22 y.o. Admit date: (Not on file) Date of Consult: 06/14/2018  Primary Physician: [name], MD Primary Cardiologist: [name] is a 22 y.o. male who is being seen today for the evaluation of tachycardia at the request of TG.    HPI Charles Copeland Charles Copeland is a 22 y.o. male with a history of tachycardia for many years the context of a complicated medical history.  His mother is a Optometrist.  He has just been diagnosed with Asperger's after a long-standing diagnoses of anxiety and depression.  He has long-standing problems with lightheadedness and headaches.  He has heat intolerance and orthostatic intolerance.  He is noted that he improves with volume and salt repletion.  He is trying to be a runner like his brother; this was very challenging both from exercise tolerance for review as well as lightheadedness point review.  He describes sleep disturbances which are characterized by arousal, chest pain, panic disorder and "I felt like as having a stroke "but he has had frequent visits to the emergency room for similar symptoms.  Oftentimes he is given volume resuscitation and discharged.  He has a history of anxiety for which she is taking benzodiazepines and has been taking them routinely at night for some time.  Efforts to withdraw or complicated.  If he is back on them on a daily basis.  He has a history of syncope.  This is been associated with blood draws, micturition as well as a series of unspecified events where he and his family would find him face down on the floor.  He has frequent presyncope with a recognizable prodrome.  He is well able to describe any symptoms but his history tends to be somewhat rambling.    Past Medical History:  Diagnosis Date  . Allergy   . Anxiety   . Asperger's syndrome   . Asthma   . Depression   . GERD (gastroesophageal  reflux disease)   . Mitral valve prolapse   . Pectus excavatum   . Syncope       Surgical History:  Past Surgical History:  Procedure Laterality Date  . nasal cautery  02/09   in OR  . NASAL SEPTUM SURGERY  7/13   [facility]--Dr Evans  . TONSILLECTOMY AND ADENOIDECTOMY  2007     Home Meds: Prior to Admission medications   Medication Sig Start Date End Date Taking? Authorizing Provider  cholecalciferol (VITAMIN D) 1000 UNITS tablet Take 1,000 Units by mouth daily.   Yes [provider]  diazepam (VALIUM) 5 MG tablet Take 2.5 mg by mouth at bedtime.   Yes [provider]  Multiple Vitamin (MULTIVITAMIN WITH MINERALS) TABS Take 1 tablet by mouth daily.   Yes [provider]    Allergies:  Allergies  Allergen Reactions  . Lexapro [Escitalopram] Other (See Comments)    Hypotension, syncope  . Remeron [Mirtazapine] Other (See Comments)    Hypotension, passed out  . Geodon [Ziprasidone] Other (See Comments)    Syncope, Hypotension  . Other Hives    Lays Potato Chips  . Seroquel [Quetiapine Fumarate] Swelling    Excessive appetite  Stiff zombie  . Aripiprazole Other (See Comments)    tics    Social History   Socioeconomic History  . Marital status: Single    Spouse name: Not on file  . Number of children: 0  .  Years of education: Not on file  . Highest education level: Not on file  Occupational History  . 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 Financial resource strain: Not on file  . Food insecurity:    Worry: Not on file    Inability: Not on file  . Transportation needs:    Medical: Not on file    Non-medical: Not on file  Tobacco Use  . Smoking status: Never Smoker  . Smokeless tobacco: Never Used  Substance and Sexual Activity  . Alcohol use: No    Alcohol/week: 0.0 standard drinks  . Drug use: No  . Sexual activity: Not on file  Lifestyle  . Physical activity:    Days per week: Not on file    Minutes per session: Not on  file  . Stress: Not on file  Relationships  . Social connections:    Talks on phone: Not on file    Gets together: Not on file    Attends religious service: Not on file    Active member of club or organization: Not on file    Attends meetings of clubs or organizations: Not on file    Relationship status: Not on file  . Intimate partner violence:    Fear of current or ex partner: Not on file    Emotionally abused: Not on file    Physically abused: Not on file    Forced sexual activity: Not on file  Other Topics Concern  . Not on file  Social History Narrative   Mother: retired Optometrist   Father: Lab director-[employer]   One brother   No smokers in house     Family History  Problem Relation Age of Onset  . Asthma Mother   . GER disease Mother   . Eczema Mother   . Allergies Mother   . Colon polyps Mother   . Hyperlipidemia Mother   . Hyperlipidemia Father   . Crohn's disease Brother   . Mitral valve prolapse Brother   . Anxiety disorder Brother   . Alcohol abuse Maternal Grandfather        ? bipolar  . Liver cancer Paternal Grandfather        LIVER  . Sarcoidosis Maternal Uncle   . Hepatitis Maternal Aunt        autoimmune  . Graves' disease Maternal Aunt      ROS:  Please see the history of present illness.     All other systems reviewed and negative.    Physical Exam: Blood pressure 120/76, height 6\' 1"  (1.854 m), weight 157 lb (71.2 kg). General: Well developed, well nourished male in no acute distress. Head: Normocephalic, atraumatic, sclera non-icteric, no xanthomas, nares are without discharge. EENT: normal  Lymph Nodes:  none Neck: Negative for carotid bruits. JVD not elevated. Back:without scoliosis kyphosis Lungs: Clear bilaterally to auscultation without wheezes, rales, or rhonchi. Breathing is unlabored. Heart: RRR with S1 S2. No murmur . No rubs, or gallops appreciated. Abdomen: Soft, non-tender, non-distended with normoactive bowel sounds. No  hepatomegaly. No rebound/guarding. No obvious abdominal masses. Msk:  Strength and tone appear normal for age. Extremities: No clubbing or cyanosis. No edema.  Distal pedal pulses are 2+ and equal bilaterally.  Arachnodactyly like Skin: Warm and Dry Neuro: Alert and oriented X 3. CN III-XII intact Grossly normal sensory and motor function . Psych:  Responds to questions appropriately with a normal affect.      Labs: Cardiac Enzymes No results for input(s): CKTOTAL,  CKMB, TROPONINI in the last 72 hours. CBC Lab Results  Component Value Date   WBC 7.4 05/04/2018   HGB 14.5 05/04/2018   HCT 44.0 05/04/2018   MCV 92.8 05/04/2018   PLT 181 05/04/2018   PROTIME: No results for input(s): LABPROT, INR in the last 72 hours. Chemistry No results for input(s): NA, K, CL, CO2, BUN, CREATININE, CALCIUM, PROT, BILITOT, ALKPHOS, ALT, AST, GLUCOSE in the last 168 hours.  Invalid input(s): LABALBU Lipids Lab Results  Component Value Date   CHOL 219 (H) 05/13/2016   HDL 45 05/13/2016   LDLCALC 150 (H) 05/13/2016   TRIG 121 05/13/2016   BNP No results found for: PROBNP Thyroid Function Tests: No results for input(s): TSH, T4TOTAL, T3FREE, THYROIDAB in the last 72 hours.  Invalid input(s): FREET3 Miscellaneous Lab Results  Component Value Date   DDIMER <0.27 11/18/2012    Radiology/Studies:  No results found.  EKG: sinus 64 08/13/37   Assessment and Plan:  Aspergers  Orthostatic intolerance  Syncope  Anxiety  We discussed extensively the issues of dysautonomia, the physiology of orthstasis and positional stress.  We discussed the role of salt and water repletion, the importance of exercise, often needing to be started in the recumbent position, and the awareness of triggers and the role of ambient heat and dehydration  We also discussed the importance of getting off the Valium and working with his doctors at [facility] and they [facility] way as to the interaction between Aspergers  anxiety and now the issues related to symptoms of autonomic insufficiency, albeit he does not qualify for diagnosis of POTS  More than 50% of 85 min was spent in counseling related to the above   Sherryl Manges

## 2018-07-03 ENCOUNTER — Ambulatory Visit: Payer: Self-pay | Admitting: Cardiovascular Disease

## 2018-07-06 ENCOUNTER — Encounter (INDEPENDENT_AMBULATORY_CARE_PROVIDER_SITE_OTHER): Payer: Self-pay

## 2018-07-09 NOTE — Telephone Encounter (Signed)
Please get him in soon

## 2018-07-13 ENCOUNTER — Encounter: Payer: Self-pay | Admitting: Internal Medicine

## 2018-07-13 ENCOUNTER — Ambulatory Visit: Payer: Managed Care, Other (non HMO) | Admitting: Internal Medicine

## 2018-07-13 VITALS — BP 118/78 | HR 55 | Temp 97.9°F | Ht 73.0 in | Wt 161.0 lb

## 2018-07-13 DIAGNOSIS — F411 Generalized anxiety disorder: Secondary | ICD-10-CM | POA: Diagnosis not present

## 2018-07-13 NOTE — Assessment & Plan Note (Signed)
Seems to be most related to social issues related to his ASD (or whatever his diagnosis is) He is weaned down on the diazepam--discussed very slow wean to get off I think he needs resources from Autism groups to find someone to help him most

## 2018-07-13 NOTE — Progress Notes (Signed)
Subjective:    Patient ID: Charles Copeland, male    DOB: 1996-07-30, 22 y.o.   MRN: [id number]  HPI Here to review anxiety and concerns about the valium  Has cut down to 1/4 tab at bedtime Has noticed night time awakening---some trouble getting back to sleep depending on what time it is Will drink milk and 5mg  melatonin--he thinks this helps  Will be starting school soon Helping with grandfather---recently passed away Not working either  Not seeing counselor at present Wasn't impressed with past efforts  Concerned about "my autism" His social discomfort is a cause of much of his problems Does hope to go into computer programming Has problems with social cues----jokes inappropriately or drones on  Current Outpatient Medications on File Prior to Visit  Medication Sig Dispense Refill  . cholecalciferol (VITAMIN D) 1000 UNITS tablet Take 1,000 Units by mouth daily.    . diazepam (VALIUM) 5 MG tablet Take 1.25 mg by mouth at bedtime.     . Multiple Vitamin (MULTIVITAMIN WITH MINERALS) TABS Take 1 tablet by mouth daily.     No current facility-administered medications on file prior to visit.     Allergies  Allergen Reactions  . Lexapro [Escitalopram] Other (See Comments)    Hypotension, syncope  . Remeron [Mirtazapine] Other (See Comments)    Hypotension, passed out  . Geodon [Ziprasidone] Other (See Comments)    Syncope, Hypotension  . Other Hives    Lays Potato Chips  . Seroquel [Quetiapine Fumarate] Swelling    Excessive appetite  Stiff zombie  . Aripiprazole Other (See Comments)    tics    Past Medical History:  Diagnosis Date  . Allergy   . Anxiety   . Asperger's syndrome   . Asthma   . Depression   . GERD (gastroesophageal reflux disease)   . Mitral valve prolapse   . Pectus excavatum   . Syncope     Past Surgical History:  Procedure Laterality Date  . nasal cautery  02/09   in OR  . NASAL SEPTUM SURGERY  7/13   [facility]--Dr Evans  . TONSILLECTOMY  AND ADENOIDECTOMY  2007    Family History  Problem Relation Age of Onset  . Asthma Mother   . GER disease Mother   . Eczema Mother   . Allergies Mother   . Colon polyps Mother   . Hyperlipidemia Mother   . Hyperlipidemia Father   . Crohn's disease Brother   . Mitral valve prolapse Brother   . Anxiety disorder Brother   . Alcohol abuse Maternal Grandfather        ? bipolar  . Liver cancer Paternal Grandfather        LIVER  . Sarcoidosis Maternal Uncle   . Hepatitis Maternal Aunt        autoimmune  . Graves' disease Maternal Aunt     Social History   Socioeconomic History  . Marital status: Single    Spouse name: Not on file  . Number of children: 0  . Years of education: Not on file  . Highest education level: Not on file  Occupational History  . 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 Financial resource strain: Not on file  . Food insecurity:    Worry: Not on file    Inability: Not on file  . Transportation needs:    Medical: Not on file    Non-medical: Not on file  Tobacco Use  . Smoking status: Never Smoker  .  Smokeless tobacco: Never Used  Substance and Sexual Activity  . Alcohol use: No    Alcohol/week: 0.0 standard drinks  . Drug use: No  . Sexual activity: Not on file  Lifestyle  . Physical activity:    Days per week: Not on file    Minutes per session: Not on file  . Stress: Not on file  Relationships  . Social connections:    Talks on phone: Not on file    Gets together: Not on file    Attends religious service: Not on file    Active member of club or organization: Not on file    Attends meetings of clubs or organizations: Not on file    Relationship status: Not on file  . Intimate partner violence:    Fear of current or ex partner: Not on file    Emotionally abused: Not on file    Physically abused: Not on file    Forced sexual activity: Not on file  Other Topics Concern  . Not on file  Social History Narrative   Mother: retired  Optometrist   Father: Lab director-[employer]   One brother   No smokers in house   Review of Systems Eating fair Weight is holding Some constipation--but much more since cutting back on the diazepam    Objective:   Physical Exam  Psychiatric:  Calm Seems to have increased insight into his chronic issues           Assessment & Plan:

## 2018-07-17 ENCOUNTER — Ambulatory Visit: Payer: Self-pay | Admitting: Internal Medicine

## 2018-08-06 NOTE — Telephone Encounter (Signed)
Please call him to set up appointment

## 2018-08-08 ENCOUNTER — Encounter: Payer: Self-pay | Admitting: Internal Medicine

## 2018-08-08 ENCOUNTER — Ambulatory Visit: Payer: Managed Care, Other (non HMO) | Admitting: Internal Medicine

## 2018-08-08 VITALS — BP 100/64 | HR 84 | Temp 97.9°F | Ht 73.0 in | Wt 159.0 lb

## 2018-08-08 DIAGNOSIS — F411 Generalized anxiety disorder: Secondary | ICD-10-CM

## 2018-08-08 DIAGNOSIS — K602 Anal fissure, unspecified: Secondary | ICD-10-CM | POA: Insufficient documentation

## 2018-08-08 MED ORDER — HYDROCORTISONE 2.5 % EX CREA: TOPICAL_CREAM | Freq: Three times a day (TID) | CUTANEOUS | 3 refills | Status: DC | PRN

## 2018-08-08 NOTE — Assessment & Plan Note (Signed)
Will set up with counselor

## 2018-08-08 NOTE — Assessment & Plan Note (Signed)
Reassured No fistula Try 2.5% hydrocortisone cream

## 2018-08-08 NOTE — Progress Notes (Signed)
Subjective:    Patient ID: Charles Copeland, male    DOB: 1995-11-28, 22 y.o.   MRN: [id number]  HPI Here due to blood in stools Having a lot of "movement" in his stomach Some better since the MyChart message Took omeprazole once symptoms better (1 dose only) These symptoms were similar to the past Red blood ---no melena but unclear about color  Off the diazepam Feels pretty good Is considering seeing a counselor again  Current Outpatient Medications on File Prior to Visit  Medication Sig Dispense Refill  . cholecalciferol (VITAMIN D) 1000 UNITS tablet Take 1,000 Units by mouth daily.    . Multiple Vitamin (MULTIVITAMIN WITH MINERALS) TABS Take 1 tablet by mouth daily.     No current facility-administered medications on file prior to visit.     Allergies  Allergen Reactions  . Lexapro [Escitalopram] Other (See Comments)    Hypotension, syncope  . Remeron [Mirtazapine] Other (See Comments)    Hypotension, passed out  . Geodon [Ziprasidone] Other (See Comments)    Syncope, Hypotension  . Other Hives    Lays Potato Chips  . Seroquel [Quetiapine Fumarate] Swelling    Excessive appetite  Stiff zombie  . Aripiprazole Other (See Comments)    tics    Past Medical History:  Diagnosis Date  . Allergy   . Anxiety   . Asperger's syndrome   . Asthma   . Depression   . GERD (gastroesophageal reflux disease)   . Mitral valve prolapse   . Pectus excavatum   . Syncope     Past Surgical History:  Procedure Laterality Date  . nasal cautery  02/09   in OR  . NASAL SEPTUM SURGERY  7/13   [facility]--Dr Evans  . TONSILLECTOMY AND ADENOIDECTOMY  2007    Family History  Problem Relation Age of Onset  . Asthma Mother   . GER disease Mother   . Eczema Mother   . Allergies Mother   . Colon polyps Mother   . Hyperlipidemia Mother   . Hyperlipidemia Father   . Crohn's disease Brother   . Mitral valve prolapse Brother   . Anxiety disorder Brother   . Alcohol abuse Maternal  Grandfather        ? bipolar  . Liver cancer Paternal Grandfather        LIVER  . Sarcoidosis Maternal Uncle   . Hepatitis Maternal Aunt        autoimmune  . Graves' disease Maternal Aunt     Social History   Socioeconomic History  . Marital status: Single    Spouse name: Not on file  . Number of children: 0  . Years of education: Not on file  . Highest education level: Not on file  Occupational History  . 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 Financial resource strain: Not on file  . Food insecurity:    Worry: Not on file    Inability: Not on file  . Transportation needs:    Medical: Not on file    Non-medical: Not on file  Tobacco Use  . Smoking status: Never Smoker  . Smokeless tobacco: Never Used  Substance and Sexual Activity  . Alcohol use: No    Alcohol/week: 0.0 standard drinks  . Drug use: No  . Sexual activity: Not on file  Lifestyle  . Physical activity:    Days per week: Not on file    Minutes per session: Not on file  . Stress: Not  on file  Relationships  . Social connections:    Talks on phone: Not on file    Gets together: Not on file    Attends religious service: Not on file    Active member of club or organization: Not on file    Attends meetings of clubs or organizations: Not on file    Relationship status: Not on file  . Intimate partner violence:    Fear of current or ex partner: Not on file    Emotionally abused: Not on file    Physically abused: Not on file    Forced sexual activity: Not on file  Other Topics Concern  . Not on file  Social History Narrative   Mother: retired Optometristediatrician   Father: Lab director-[employer]   One brother   No smokers in house   Review of Systems Some migraines Appetite is okay No weight loss Some nausea but no vomiting    Objective:   Physical Exam  Constitutional: No distress.  Neck: No thyromegaly present.  Cardiovascular: Normal rate, regular rhythm and normal heart sounds. Exam reveals  no gallop.  No murmur heard. Respiratory: Effort normal and breath sounds normal. No respiratory distress. He has no wheezes. He has no rales.  GI: Soft. He exhibits no distension. There is no tenderness. There is no rebound and no guarding.  Genitourinary:  Genitourinary Comments: Fissure without active bleeding at 12 o'clock  Lymphadenopathy:    He has no cervical adenopathy.           Assessment & Plan:

## 2018-08-22 ENCOUNTER — Encounter: Payer: Self-pay | Admitting: Gastroenterology

## 2018-09-07 ENCOUNTER — Telehealth: Payer: Self-pay

## 2018-09-07 NOTE — Telephone Encounter (Signed)
I can add him on add 12:45 if needed, but it depends on how many he might have taken. If 6-8 or less---should be able to monitor at home (BP and pulse) If more, may need extended monitoring and should have considered charcoal, etc

## 2018-09-07 NOTE — Telephone Encounter (Signed)
Glad to hear that it was only 1 tab if any. Great to hear that he is heading back to school, etc

## 2018-09-07 NOTE — Telephone Encounter (Signed)
Pt had only taken one atenolol 25 mg or may not have taken any atenolol; pt is not sure. Dr Alphonsus SiasLetvak advised if pt is awake and walking around this afternoon with no problem then would not need to be seen. Pt may have some dizziness if only took one Atenolol. Dr Sheryle SprayValcour notified as instructed and voiced understanding and is appreciative. Dr Sheryle SprayValcour wanted Dr Alphonsus SiasLetvak to know that pt has been off all psych meds for one month and doing fantastic. Pt is starting classes on 09/11/18. FYI to Dr Alphonsus SiasLetvak.

## 2018-09-07 NOTE — Telephone Encounter (Signed)
Pts mother a retired MD (No DPR signed to speak with anyone else) said pt is not sure but thinks may have taken his brothers Atenolol 25 mg this morning. Pt panicked and called EMS; EMS is at the home and 12 lead EKG done and glucose done and both are OK. EMS recommends pt to be seen this afternoon to take pts BP and P. No available appts at [facility].Dr Sheryle Spray does not want to call poison control or go to [facility] or ED. Dr Sheryle Spray request info given to Dr Alphonsus Sias and request cb. Mandy RN said OK to send Dr Alphonsus Sias a note for his advice.

## 2018-09-17 ENCOUNTER — Ambulatory Visit: Payer: Managed Care, Other (non HMO) | Admitting: Gastroenterology

## 2018-09-17 ENCOUNTER — Encounter: Payer: Self-pay | Admitting: Gastroenterology

## 2018-09-17 VITALS — BP 108/60 | HR 62 | Ht 72.5 in | Wt 158.0 lb

## 2018-09-17 DIAGNOSIS — K5909 Other constipation: Secondary | ICD-10-CM

## 2018-09-17 DIAGNOSIS — K625 Hemorrhage of anus and rectum: Secondary | ICD-10-CM | POA: Diagnosis not present

## 2018-09-17 MED ORDER — NA SULFATE-K SULFATE-MG SULF 17.5-3.13-1.6 GM/177ML PO SOLN: 1.0000 | Freq: Once | ORAL | 0 refills | Status: AC

## 2018-09-17 NOTE — Progress Notes (Signed)
     [facility] Progress Note  Chief Complaint: Chronic constipation and bleeding.  Subjective  History:   Seen in office consult August 2019 for lower abdominal pain and altered bowel habits in the setting of severe anxiety disorder with changes in multiple psychiatric meds.  He has a family history of Crohn's disease, colonoscopy was recommended.  He did not feel he was ready to undergo that, wanted to give it more consideration, and we have not seen or heard from him since then.  His symptoms are much the same as before, with intermittent sharp abdominal pain, bloating and rumbling, intermittent constipation and rectal bleeding.  Bleeding tends to occur when bowel movements are large or firm or with straining.  His mood is stabilized, his sleep improved and he has eliminated some gastric medicine.  ROS: Cardiovascular:  no chest pain Respiratory: no dyspnea Denies weight loss Anxiety as before Remainder of systems negative except as above  The patient's Past Medical, Family and Social History were reviewed and are on file in the EMR.  Objective:  Med list reviewed  Current Outpatient Medications:  .  cholecalciferol (VITAMIN D) 1000 UNITS tablet, Take 1,000 Units by mouth daily., Disp: , Rfl:  .  Multiple Vitamin (MULTIVITAMIN WITH MINERALS) TABS, Take 1 tablet by mouth daily., Disp: , Rfl:  .  omeprazole (PRILOSEC) 20 MG capsule, Take 20 mg by mouth daily., Disp: , Rfl:  .  Na Sulfate-K Sulfate-Mg Sulf 17.5-3.13-1.6 GM/177ML SOLN, Take 1 kit by mouth once for 1 dose., Disp: 354 mL, Rfl: 0  Also taking some Prilosec at bedtime for intermittent chest pain that he thinks may be reflux.  Vital signs in last 24 hrs: Vitals:   09/17/18 1558  BP: 108/60  Pulse: 62    Physical Exam  Well-appearing, restricted affect as before  HEENT: sclera anicteric, oral mucosa moist without lesions  Neck: supple, no thyromegaly, JVD or lymphadenopathy  Cardiac: RRR without  murmurs, S1S2 heard, no peripheral edema  Pulm: clear to auscultation bilaterally, normal RR and effort noted  Abdomen: soft, no tenderness, with active bowel sounds. No guarding or palpable hepatosplenomegaly.  Skin; warm and dry, no jaundice or rash  No recent data   '@ASSESSMENTPLANBEGIN'$ @ Assessment: Encounter Diagnoses  Name Primary?  . Chronic constipation Yes  . 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  I still feel he should have a colonoscopy because there has been bleeding and he has a brother with Crohn's disease.  He is agreeable, and procedure was reviewed in detail along with risks and benefits.  The benefits and risks of the planned procedure were described in detail with the patient or (when appropriate) their health care proxy.  Risks were outlined as including, but not limited to, bleeding, infection, perforation, adverse medication reaction leading to cardiac or pulmonary decompensation, or pancreatitis (if ERCP).  The limitation of incomplete mucosal visualization was also discussed.  No guarantees or warranties were given.  I am not currently prescribing him medication, as I am not sure there is any that would likely be helpful for him.  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   Total time 25 minutes, over half spent face-to-face with patient in counseling and coordination of care.   Nelida Meuse III

## 2018-09-17 NOTE — Patient Instructions (Addendum)
If you are age 24 or older, your body mass index should be between 23-30. Your Body mass index is 21.13 kg/m. If this is out of the aforementioned range listed, please consider follow up with your Primary Care Provider.  If you are age 164 or younger, your body mass index should be between 19-25. Your Body mass index is 21.13 kg/m. If this is out of the aformentioned range listed, please consider follow up with your Primary Care Provider.   You have been scheduled for a colonoscopy. Please follow written instructions given to you at your visit today.  Please pick up your prep supplies at the pharmacy within the next 1-3 days. If you use inhalers (even only as needed), please bring them with you on the day of your procedure. Your physician has requested that you go to www.startemmi.com and enter the access code given to you at your visit today. This web site gives a general overview about your procedure. However, you should still follow specific instructions given to you by our office regarding your preparation for the procedure.  It was a pleasure to see you today!  Dr. Myrtie Neitheranis   _____________________________________________________________________  Food Guidelines for gas and bloating  Many people have difficulty digesting certain foods, causing a variety of distressing and embarrassing symptoms such as abdominal pain, bloating and gas.  These foods may need to be avoided or consumed in small amounts.  Here are some tips that might be helpful for you.  1.   Lactose intolerance is the difficulty or complete inability to digest lactose, the natural sugar in milk and anything made from milk.  This condition is harmless, common, and can begin any time during life.  Some people can digest a modest amount of lactose while others cannot tolerate any.  Also, not all dairy products contain equal amounts of lactose.  For example, hard cheeses such as parmesan have less lactose than soft cheeses such as  cheddar.  Yogurt has less lactose than milk or cheese.  Many packaged foods (even many brands of bread) have milk, so read ingredient lists carefully.  It is difficult to test for lactose intolerance, so just try avoiding lactose as much as possible for a week and see what happens with your symptoms.  If you seem to be lactose intolerant, the best plan is to avoid it (but make sure you get calcium from another source).  The next best thing is to use lactase enzyme supplements, available over the counter everywhere.  Just know that many lactose intolerant people need to take several tablets with each serving of dairy to avoid symptoms.  Lastly, a lot of restaurant food is made with milk or butter.  Many are things you might not suspect, such as mashed potatoes, rice and pasta (cooked with butter) and "grilled" items.  If you are lactose intolerant, it never hurts to ask your server what has milk or butter.  2.   Fiber is an important part of your diet, but not all fiber is well-tolerated.  Insoluble fiber such as bran is often consumed by normal gut bacteria and converted into gas.  Soluble fiber such as oats, squash, carrots and green beans are typically tolerated better.  3.   Some types of carbohydrates can be poorly digested.  Examples include: fructose (apples, cherries, pears, raisins and other dried fruits), fructans (onions, zucchini, large amounts of wheat), sorbitol/mannitol/xylitol and sucralose/Splenda (common artificial sweeteners), and raffinose (lentils, broccoli, cabbage, asparagus, brussel sprouts, many types of  beans).  Do a Programmer, multimediaweb search for National CityFODMAP diet and you will find helpful information. Beano, a dietary supplement, will often help with raffinose-containing foods.  As with lactase tablets, you may need several per serving.  4.   Whenever possible, avoid processed food&meats and chemical additives.  High fructose corn syrup, a common sweetener, may be difficult to digest.  Eggs and soy  (comes from the soybean, and added to many foods now) are other common bloating/gassy foods.  - Dr. Sherlynn CarbonHenry Danis [facility]

## 2018-09-24 ENCOUNTER — Telehealth: Payer: Self-pay | Admitting: Gastroenterology

## 2018-09-25 ENCOUNTER — Encounter: Payer: Self-pay | Admitting: Gastroenterology

## 2018-09-27 ENCOUNTER — Telehealth: Payer: Self-pay | Admitting: Internal Medicine

## 2018-09-27 DIAGNOSIS — F419 Anxiety disorder, unspecified: Secondary | ICD-10-CM

## 2018-09-27 NOTE — Telephone Encounter (Signed)
Pt's mother called office in regards to having the pt referred to a therapist. The pt's mother is not on dpr so I told her I would send a message. Pt's mother said she spoke with Dr.Letvak briefly. Pt is experiencing anxiety and having panic attacks in the middle of the night. Pt's mother tried calling behavioral health but was told he needs a referral. She prefers her son to see Charlyne Mom, PHD because she specializes in autism.

## 2018-09-28 NOTE — Telephone Encounter (Signed)
Please let her know the referral has placed placed and he should hear directly from our behavioral health staff

## 2018-09-28 NOTE — Telephone Encounter (Signed)
Spoke to Mom per [employer]

## 2018-10-23 ENCOUNTER — Ambulatory Visit: Payer: 59 | Admitting: Psychology

## 2018-10-24 ENCOUNTER — Ambulatory Visit (AMBULATORY_SURGERY_CENTER): Payer: Managed Care, Other (non HMO) | Admitting: Gastroenterology

## 2018-10-24 ENCOUNTER — Encounter: Payer: Self-pay | Admitting: Gastroenterology

## 2018-10-24 VITALS — BP 102/67 | HR 62 | Temp 98.6°F | Resp 12 | Ht 72.0 in | Wt 158.0 lb

## 2018-10-24 DIAGNOSIS — R1030 Lower abdominal pain, unspecified: Secondary | ICD-10-CM

## 2018-10-24 DIAGNOSIS — K5909 Other constipation: Secondary | ICD-10-CM | POA: Diagnosis not present

## 2018-10-24 DIAGNOSIS — K625 Hemorrhage of anus and rectum: Secondary | ICD-10-CM

## 2018-10-24 MED ORDER — SODIUM CHLORIDE 0.9 % IV SOLN: 500.0000 mL | INTRAVENOUS | Status: DC

## 2018-10-24 NOTE — Op Note (Signed)
[facility] Patient Name: Charles Copeland Procedure Date: 10/24/2018 8:06 AM MRN: [id number] Endoscopist: Sherilyn Cooter L. Myrtie Neither , MD Age: 23 Referring MD:  Date of Birth: 12-18-1995 Gender: Male Account #: [id number] Procedure:                Colonoscopy Indications:              Lower abdominal pain, Rectal bleeding,                            Constipation, family history of Crohn's disease Medicines:                Monitored Anesthesia Care Procedure:                Pre-Anesthesia Assessment:                           - Prior to the procedure, a History and Physical                            was performed, and patient medications and                            allergies were reviewed. The patient's tolerance of                            previous anesthesia was also reviewed. The risks                            and benefits of the procedure and the sedation                            options and risks were discussed with the patient.                            All questions were answered, and informed consent                            was obtained. Prior Anticoagulants: The patient has                            taken no previous anticoagulant or antiplatelet                            agents. ASA Grade Assessment: II - A patient with                            mild systemic disease. After reviewing the risks                            and benefits, the patient was deemed in                            satisfactory condition to undergo the procedure.  After obtaining informed consent, the colonoscope                            was passed under direct vision. Throughout the                            procedure, the patient's blood pressure, pulse, and                            oxygen saturations were monitored continuously. The                            Colonoscope was introduced through the anus and                            advanced to the the  terminal ileum, with                            identification of the appendiceal orifice and IC                            valve. The colonoscopy was performed without                            difficulty. The patient tolerated the procedure                            well. The quality of the bowel preparation was                            excellent. The terminal ileum, ileocecal valve,                            appendiceal orifice, and rectum were photographed.                            The bowel preparation used was SUPREP. Scope In: 8:10:32 AM Scope Out: 8:22:31 AM Scope Withdrawal Time: 0 hours 8 minutes 12 seconds  Total Procedure Duration: 0 hours 11 minutes 59 seconds  Findings:                 The perianal and digital rectal examinations were                            normal.                           The terminal ileum appeared normal.                           The entire examined colon appeared normal on direct                            and retroflexion views. Complications:            No immediate complications.  Estimated Blood Loss:     Estimated blood loss: none. Impression:               - The examined portion of the ileum was normal.                           - The entire examined colon is normal on direct and                            retroflexion views.                           - No specimens collected.                           Benign ano-rectal bleeding related to constipation. Recommendation:           - Patient has a contact number available for                            emergencies. The signs and symptoms of potential                            delayed complications were discussed with the                            patient. Return to normal activities tomorrow.                            Written discharge instructions were provided to the                            patient.                           - Resume previous diet.                           -  Continue present medications.                           - No recommendation at this time regarding repeat                            colonoscopy due to young age.                           - Miralax as-needed for constipation. Lailie Smead L. Myrtie Neither, MD 10/24/2018 8:26:54 AM This report has been signed electronically.

## 2018-10-24 NOTE — Patient Instructions (Signed)
Continue present medications. Miralax as needed for constipation.    YOU HAD AN ENDOSCOPIC PROCEDURE TODAY AT THE [facility]:   Refer to the procedure report that was given to you for any specific questions about what was found during the examination.  If the procedure report does not answer your questions, please call your gastroenterologist to clarify.  If you requested that your care partner not be given the details of your procedure findings, then the procedure report has been included in a sealed envelope for you to review at your convenience later.  YOU SHOULD EXPECT: Some feelings of bloating in the abdomen. Passage of more gas than usual.  Walking can help get rid of the air that was put into your GI tract during the procedure and reduce the bloating. If you had a lower endoscopy (such as a colonoscopy or flexible sigmoidoscopy) you may notice spotting of blood in your stool or on the toilet paper. If you underwent a bowel prep for your procedure, you may not have a normal bowel movement for a few days.  Please Note:  You might notice some irritation and congestion in your nose or some drainage.  This is from the oxygen used during your procedure.  There is no need for concern and it should clear up in a day or so.  SYMPTOMS TO REPORT IMMEDIATELY:   Following lower endoscopy (colonoscopy or flexible sigmoidoscopy):  Excessive amounts of blood in the stool  Significant tenderness or worsening of abdominal pains  Swelling of the abdomen that is new, acute  Fever of 100F or higher    For urgent or emergent issues, a gastroenterologist can be reached at any hour by calling [phone].   DIET:  We do recommend a small meal at first, but then you may proceed to your regular diet.  Drink plenty of fluids but you should avoid alcoholic beverages for 24 hours.  ACTIVITY:  You should plan to take it easy for the rest of today and you should NOT DRIVE or use heavy machinery  until tomorrow (because of the sedation medicines used during the test).    FOLLOW UP: Our staff will call the number listed on your records the next business day following your procedure to check on you and address any questions or concerns that you may have regarding the information given to you following your procedure. If we do not reach you, we will leave a message.  However, if you are feeling well and you are not experiencing any problems, there is no need to return our call.  We will assume that you have returned to your regular daily activities without incident.  If any biopsies were taken you will be contacted by phone or by letter within the next 1-3 weeks.  Please call us at [phone] if you have not heard about the biopsies in 3 weeks.    SIGNATURES/CONFIDENTIALITY: You and/or your care partner have signed paperwork which will be entered into your electronic medical record.  These signatures attest to the fact that that the information above on your After Visit Summary has been reviewed and is understood.  Full responsibility of the confidentiality of this discharge information lies with you and/or your care-partner.

## 2018-10-24 NOTE — Progress Notes (Signed)
To PACU, VSS. Report to [email or web address].tb 

## 2018-10-25 ENCOUNTER — Encounter: Payer: Self-pay | Admitting: Internal Medicine

## 2018-10-25 ENCOUNTER — Ambulatory Visit: Payer: Managed Care, Other (non HMO) | Admitting: Internal Medicine

## 2018-10-25 ENCOUNTER — Telehealth: Payer: Self-pay

## 2018-10-25 VITALS — BP 118/70 | HR 75 | Temp 98.0°F | Ht 72.5 in | Wt 155.0 lb

## 2018-10-25 DIAGNOSIS — R002 Palpitations: Secondary | ICD-10-CM

## 2018-10-25 MED ORDER — PROPRANOLOL HCL 10 MG PO TABS: 10.0000 mg | ORAL_TABLET | Freq: Three times a day (TID) | ORAL | 1 refills | Status: AC | PRN

## 2018-10-25 NOTE — Progress Notes (Signed)
Subjective:    Patient ID: Charles Copeland, male    DOB: 29-Apr-1996, 23 y.o.   MRN: [id number]  HPI Here due to palpitations  Noted after the anaesthesia wore off from colonoscopy yesterday, he felt his chest thumping BP had been okay when recovering Reminded him of being on valium  Some chest pain----"my heart goes thump, thump" No SOB No dizziness but had some confusion yesterday--better today  Current Outpatient Medications on File Prior to Visit  Medication Sig Dispense Refill  . cholecalciferol (VITAMIN D) 1000 UNITS tablet Take 1,000 Units by mouth daily.    Marland Kitchen ibuprofen (ADVIL,MOTRIN) 200 MG tablet Take 200 mg by mouth every 6 (six) hours as needed.    . Multiple Vitamin (MULTIVITAMIN WITH MINERALS) TABS Take 1 tablet by mouth daily.    Marland Kitchen omeprazole (PRILOSEC) 20 MG capsule Take 20 mg by mouth daily.     No current facility-administered medications on file prior to visit.     Allergies  Allergen Reactions  . Lexapro [Escitalopram] Other (See Comments)    Hypotension, syncope  . Remeron [Mirtazapine] Other (See Comments)    Hypotension, passed out  . Geodon [Ziprasidone] Other (See Comments)    Syncope, Hypotension  . Other Hives    Lays Potato Chips  . Seroquel [Quetiapine Fumarate] Swelling    Excessive appetite  Stiff zombie  . Valium [Diazepam]     "Sleeping spells - confusion"  . Aripiprazole Other (See Comments)    tics    Past Medical History:  Diagnosis Date  . Allergy   . Anxiety   . Asperger's syndrome   . Asthma    as a child  . Depression   . GERD (gastroesophageal reflux disease)   . Mitral valve prolapse   . Pectus excavatum   . Seizures (HCC)    an 2019 - medication caused seizure per pt  . Syncope     Past Surgical History:  Procedure Laterality Date  . nasal cautery  02/09   in OR  . NASAL SEPTUM SURGERY  7/13   [facility]--Dr Evans  . TONSILLECTOMY AND ADENOIDECTOMY  2007    Family History  Problem Relation Age of Onset  .  Asthma Mother   . GER disease Mother   . Eczema Mother   . Allergies Mother   . Colon polyps Mother   . Hyperlipidemia Mother   . Hyperlipidemia Father   . Crohn's disease Brother   . Mitral valve prolapse Brother   . Anxiety disorder Brother   . Alcohol abuse Maternal Grandfather        ? bipolar  . Liver cancer Paternal Grandfather        LIVER  . Sarcoidosis Maternal Uncle   . Hepatitis Maternal Aunt        autoimmune  . Graves' disease Maternal Aunt   . Esophageal cancer Neg Hx   . Rectal cancer Neg Hx   . Stomach cancer Neg Hx   . Colon cancer Neg Hx     Social History   Socioeconomic History  . Marital status: Single    Spouse name: Not on file  . Number of children: 0  . Years of education: Not on file  . Highest education level: Not on file  Occupational History  . 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 Financial resource strain: Not on file  . Food insecurity:    Worry: Not on file    Inability: Not on file  .  Transportation needs:    Medical: Not on file    Non-medical: Not on file  Tobacco Use  . Smoking status: Never Smoker  . Smokeless tobacco: Never Used  Substance and Sexual Activity  . Alcohol use: No    Alcohol/week: 0.0 standard drinks  . Drug use: No  . Sexual activity: Not on file  Lifestyle  . Physical activity:    Days per week: Not on file    Minutes per session: Not on file  . Stress: Not on file  Relationships  . Social connections:    Talks on phone: Not on file    Gets together: Not on file    Attends religious service: Not on file    Active member of club or organization: Not on file    Attends meetings of clubs or organizations: Not on file    Relationship status: Not on file  . Intimate partner violence:    Fear of current or ex partner: Not on file    Emotionally abused: Not on file    Physically abused: Not on file    Forced sexual activity: Not on file  Other Topics Concern  . Not on file  Social History  Narrative   Mother: retired Optometrist   Father: Lab director-[employer]   One brother   No smokers in house   Review of Systems No fever Some nausea last night--gone today Eating okay today    Objective:   Physical Exam  Constitutional: He appears well-developed. No distress.  Neck: No thyromegaly present.  Cardiovascular: Normal rate, regular rhythm and normal heart sounds. Exam reveals no gallop.  No murmur heard. Respiratory: Effort normal and breath sounds normal. No respiratory distress. He has no wheezes. He has no rales.  Musculoskeletal:        General: No edema.  Lymphadenopathy:    He has no cervical adenopathy.           Assessment & Plan:

## 2018-10-25 NOTE — Telephone Encounter (Signed)
First follow up phone call attempt, unable to leave msg.

## 2018-10-25 NOTE — Telephone Encounter (Signed)
  Follow up Call-  Call back number 10/24/2018  Post procedure Call Back phone  # #[phone] hm   Permission to leave phone message Yes  Some recent data might be hidden     Patient questions:  Do you have a fever, pain , or abdominal swelling? No. Pain Score  0 *  Have you tolerated food without any problems? Yes.    Have you been able to return to your normal activities? Yes.    Do you have any questions about your discharge instructions: Diet   No. Medications  No. Follow up visit  No.  Do you have questions or concerns about your Care? Yes.   Patient had a difficult time sleeping last night because he felt like his heart was beating fast.  He has a cardiologist and this RN advised patient to speak with cardiologist if he has any questions or concerns about his heart.  Patient verbalized understanding. Actions: * If pain score is 4 or above: No action needed, pain <4.

## 2018-10-25 NOTE — Assessment & Plan Note (Signed)
Did not have benzodiazepine yesterday--doubt this is like his past "withdrawal" when coming off diazepam (only had propofol) This has been a recurrent issue--cardiac monitor did not show arrhythmia  Will prescribe propranolol 10mg  to have prn for these symptoms

## 2018-11-16 NOTE — Telephone Encounter (Signed)
Pt left v/m; pt seen 10/25/18 with racing heart;pt has been taking fenofexodine and antacids for awhile and pt had not had problems until recently. Pt wants to know if Dr Alphonsus Sias thinks could be related to fast heart beat and H/As. Pt has already sent my chart message.

## 2018-11-27 DIAGNOSIS — Z0279 Encounter for issue of other medical certificate: Secondary | ICD-10-CM

## 2018-12-06 ENCOUNTER — Ambulatory Visit: Payer: Self-pay | Admitting: Internal Medicine

## 2018-12-16 ENCOUNTER — Telehealth: Payer: Managed Care, Other (non HMO) | Admitting: Family

## 2018-12-16 DIAGNOSIS — K219 Gastro-esophageal reflux disease without esophagitis: Secondary | ICD-10-CM

## 2018-12-16 DIAGNOSIS — T7840XA Allergy, unspecified, initial encounter: Secondary | ICD-10-CM

## 2018-12-16 NOTE — Progress Notes (Signed)
Based on what you shared with me, I feel your condition warrants further evaluation and I recommend that you be seen for a face to face office visit.  Given your symptoms, I feel that it would be best to be seen face to face. You may need to see an allergen specialists for testing.   NOTE: If you entered your credit card information for this eVisit, you will not be charged. You may see a "hold" on your card for the $35 but that hold will drop off and you will not have a charge processed.  If you are having a true medical emergency please call 911.  If you need an urgent face to face visit, [place] has four urgent care centers for your convenience.    PLEASE NOTE: THE [facility] LOCATIONS AND URGENT CARE CLINICS DO NOT HAVE THE TESTING FOR CORONAVIRUS COVID19 AVAILABLE.  IF YOU FEEL YOU NEED THIS TEST YOU MUST GO TO A TRIAGE LOCATION AT ONE OF THE HOSPITAL EMERGENCY DEPARTMENTS   [email or web address] to reserve your spot online an avoid wait times  [facility] [address] [place], [place] [address] Modified hours of operation: Monday-Friday, 10 AM to 6 PM  Saturday & Sunday 10 AM to 4 PM *Across the street from [employer]  [employer] (New Address!) [address] [place], [place] [address] *Just off [employer], across the road from [place]* Modified hours of operation: Monday-Friday, 10 AM to 5 PM  Closed Saturday & Sunday   The following sites will take your insurance:  . [facility]  [phone] Get Driving Directions Find a Provider at this Location  [address] [place], [place] [address] . 10 am to 8 pm Monday-Friday . 12 pm to 8 pm Saturday-Sunday   . [facility] at [facility]  [phone] Get Driving Directions Find a Provider at this Location  [address] [place], [place] [address] . 8 am to 8 pm Monday-Friday . 9 am to 6 pm  Saturday . 11 am to 6 pm Sunday   . [facility] at [facility]  [phone] Get Driving Directions  [address] [place], [place] [address] . 8 am to 8 pm Monday-Friday . 8 am to 4 pm Saturday-Sunday   Your e-visit answers were reviewed by a board certified advanced clinical practitioner to complete your personal care plan.  Thank you for using e-Visits.

## 2018-12-24 ENCOUNTER — Other Ambulatory Visit: Payer: Self-pay

## 2018-12-24 ENCOUNTER — Encounter: Payer: Self-pay | Admitting: Gastroenterology

## 2018-12-24 ENCOUNTER — Ambulatory Visit: Payer: Managed Care, Other (non HMO) | Admitting: Gastroenterology

## 2018-12-24 DIAGNOSIS — R1030 Lower abdominal pain, unspecified: Secondary | ICD-10-CM

## 2018-12-24 NOTE — Progress Notes (Deleted)
This patient contacted our office requesting a physician telemedicine video consultation regarding clinical questions and/or test results.  If new patient, they were referred by ***  Participants on the *** : ***   The patient consented to phone consultation and was aware that a charge will be placed through their insurance.  I was in my office and the patient was ***  ***  Encounter time:  Total time *** minutes, with *** minutes spent with patient on phone/webex    Amada Jupiter, MD   _____________________________________________________________________________________________               [facility] Progress Note  Chief Complaint: ***  Subjective  History:  *** Seen January 2020 (and also in August 2019) for lower abdominal pain with altered bowel habits in the setting of severe anxiety disorder and changes in multiple psychiatric meds.  There is a family history of Crohn's disease in a sibling.  Colonoscopy 10/24/2018 was normal. Charles Copeland was having intermittent sharp abdominal pain, bloating and rumbling, intermittent constipation and rectal bleeding. ROS: Cardiovascular:  no chest pain Respiratory: no dyspnea  The patient's Past Medical, Family and Social History were reviewed and are on file in the EMR.  Objective:  Med list reviewed  Current Outpatient Medications:  .  cholecalciferol (VITAMIN D) 1000 UNITS tablet, Take 1,000 Units by mouth daily., Disp: , Rfl:  .  ibuprofen (ADVIL,MOTRIN) 200 MG tablet, Take 200 mg by mouth every 6 (six) hours as needed., Disp: , Rfl:  .  Multiple Vitamin (MULTIVITAMIN WITH MINERALS) TABS, Take 1 tablet by mouth daily., Disp: , Rfl:  .  omeprazole (PRILOSEC) 20 MG capsule, Take 20 mg by mouth daily., Disp: , Rfl:  .  propranolol (INDERAL) 10 MG tablet, Take 1 tablet (10 mg total) by mouth 3 (three) times daily as needed. For palpitations, Disp: 30 tablet, Rfl: 1   Vital signs in last 24 hrs: There were no vitals filed for  this visit.  Physical Exam  ***  HEENT: sclera anicteric, oral mucosa moist without lesions  Neck: supple, no thyromegaly, JVD or lymphadenopathy  Cardiac: RRR without murmurs, S1S2 heard, no peripheral edema  Pulm: clear to auscultation bilaterally, normal RR and effort noted  Abdomen: soft, *** tenderness, with active bowel sounds. No guarding or palpable hepatosplenomegaly.  Skin; warm and dry, no jaundice or rash  Recent Labs:    Radiologic studies:    @ASSESSMENTPLANBEGIN @ Assessment: No diagnosis found.    Plan:    Total time *** minutes, over half spent face-to-face with patient in counseling and coordination of care.   Charles Copeland

## 2018-12-24 NOTE — Progress Notes (Signed)
I was unable to be on a video conference at 830 this morning as scheduled.  Patient was called by our office with request that he be available about 1130 this morning.   I left him a voicemail after the first missed call on the mobile phone, letting him know I would try again a little later.  I was unable to reach him despite several subsequent phone calls to both home and mobile numbers between 11:30 AM and 12 PM.

## 2018-12-27 ENCOUNTER — Telehealth: Payer: Self-pay

## 2018-12-27 NOTE — Telephone Encounter (Addendum)
You has asked to have this patient put on your schedule next week. Charles Copeland at this time wants to hold off. He has started a new allergy medication and is feeling great. All his GI symptoms have resolved at this time due to the new medication livocitrizine He will call us if things change.

## 2018-12-27 NOTE — Telephone Encounter (Signed)
Understood, thanks for the update.

## 2018-12-27 NOTE — Telephone Encounter (Signed)
Please contact this patient and arrange a video-conference with me for next week.  Thank you.  - HD

## 2018-12-31 ENCOUNTER — Encounter: Payer: Self-pay | Admitting: Internal Medicine

## 2018-12-31 ENCOUNTER — Ambulatory Visit (INDEPENDENT_AMBULATORY_CARE_PROVIDER_SITE_OTHER): Payer: Managed Care, Other (non HMO) | Admitting: Internal Medicine

## 2018-12-31 ENCOUNTER — Ambulatory Visit: Payer: Managed Care, Other (non HMO) | Admitting: Family Medicine

## 2018-12-31 ENCOUNTER — Other Ambulatory Visit: Payer: Self-pay

## 2018-12-31 DIAGNOSIS — L659 Nonscarring hair loss, unspecified: Secondary | ICD-10-CM | POA: Insufficient documentation

## 2018-12-31 NOTE — Assessment & Plan Note (Signed)
Patchy hair loss No evidence of infection or scaling His mom is a physician and notes changes in color and texture as well She relates multiple family members with autoimmune disease--so is concerned about that Will check some labs May need in person visit if he has worsening symptoms that defy explanation (though he still seems to have anxiety as an overarching issue)

## 2018-12-31 NOTE — Progress Notes (Signed)
Subjective:    Patient ID: Charles Copeland, male    DOB: 10/03/1995, 23 y.o.   MRN: [id number]  HPI Virtual visit for concerns about hair loss Identification done Reviewed billing and he gave consent He is in his home--I am in my office His mother is there also and helps with the history  He feels his hair is thinning Started about 2 weeks ago Hair coming out a lot in the shower Feels stiff to him He hasn't cut his hair Has a spot that actually looks bare on the top--"knotting up a lot"  Eating okay Takes multivitamin also No change in energy levels Weight down slightly now  Mom notes his anxiety has been worse Heart racing, cramping in chest, not sleeping great She corroborates the stiffness in hair and thinning He has been cold lately---and needing extra layers  Current Outpatient Medications on File Prior to Visit  Medication Sig Dispense Refill  . cholecalciferol (VITAMIN D) 1000 UNITS tablet Take 1,000 Units by mouth daily.    Marland Kitchen. ibuprofen (ADVIL,MOTRIN) 200 MG tablet Take 200 mg by mouth every 6 (six) hours as needed.    . Multiple Vitamin (MULTIVITAMIN WITH MINERALS) TABS Take 1 tablet by mouth daily.    Marland Kitchen. omeprazole (PRILOSEC) 20 MG capsule Take 20 mg by mouth daily.    . propranolol (INDERAL) 10 MG tablet Take 1 tablet (10 mg total) by mouth 3 (three) times daily as needed. For palpitations 30 tablet 1   No current facility-administered medications on file prior to visit.     Allergies  Allergen Reactions  . Lexapro [Escitalopram] Other (See Comments)    Hypotension, syncope  . Remeron [Mirtazapine] Other (See Comments)    Hypotension, passed out  . Geodon [Ziprasidone] Other (See Comments)    Syncope, Hypotension  . Other Hives    Lays Potato Chips  . Seroquel [Quetiapine Fumarate] Swelling    Excessive appetite  Stiff zombie  . Valium [Diazepam]     "Sleeping spells - confusion"  . Aripiprazole Other (See Comments)    tics    Past Medical History:   Diagnosis Date  . Allergy   . Anxiety   . Asperger's syndrome   . Asthma    as a child  . Depression   . GERD (gastroesophageal reflux disease)   . Mitral valve prolapse   . Pectus excavatum   . Seizures (HCC)    an 2019 - medication caused seizure per pt  . Syncope     Past Surgical History:  Procedure Laterality Date  . nasal cautery  02/09   in OR  . NASAL SEPTUM SURGERY  7/13   [facility]--Dr Evans  . TONSILLECTOMY AND ADENOIDECTOMY  2007    Family History  Problem Relation Age of Onset  . Asthma Mother   . GER disease Mother   . Eczema Mother   . Allergies Mother   . Colon polyps Mother   . Hyperlipidemia Mother   . Hyperlipidemia Father   . Crohn's disease Brother   . Mitral valve prolapse Brother   . Anxiety disorder Brother   . Alcohol abuse Maternal Grandfather        ? bipolar  . Liver cancer Paternal Grandfather        LIVER  . Sarcoidosis Maternal Uncle   . Hepatitis Maternal Aunt        autoimmune  . Graves' disease Maternal Aunt   . Esophageal cancer Neg Hx   . Rectal cancer  Neg Hx   . Stomach cancer Neg Hx   . Colon cancer Neg Hx     Social History   Socioeconomic History  . Marital status: Single    Spouse name: Not on file  . Number of children: 0  . Years of education: Not on file  . Highest education level: Not on file  Occupational History  . 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 Financial resource strain: Not on file  . Food insecurity:    Worry: Not on file    Inability: Not on file  . Transportation needs:    Medical: Not on file    Non-medical: Not on file  Tobacco Use  . Smoking status: Never Smoker  . Smokeless tobacco: Never Used  Substance and Sexual Activity  . Alcohol use: No    Alcohol/week: 0.0 standard drinks  . Drug use: No  . Sexual activity: Not on file  Lifestyle  . Physical activity:    Days per week: Not on file    Minutes per session: Not on file  . Stress: Not on file  Relationships  .  Social connections:    Talks on phone: Not on file    Gets together: Not on file    Attends religious service: Not on file    Active member of club or organization: Not on file    Attends meetings of clubs or organizations: Not on file    Relationship status: Not on file  . Intimate partner violence:    Fear of current or ex partner: Not on file    Emotionally abused: Not on file    Physically abused: Not on file    Forced sexual activity: Not on file  Other Topics Concern  . Not on file  Social History Narrative   Mother: retired Optometrist   Father: Lab director-[employer]   One brother   No smokers in house   Review of Systems Some allergy symptoms---using the OTC med Some acid symptoms---he controls to some degree with managing his diet (see mychart message) Does use the omeprazole once in a while    Objective:   Physical Exam  Constitutional: He appears well-developed. No distress.  Respiratory: Effort normal. No respiratory distress.  Skin:  Some generalized thinning of hair on scalp. No rash or scaling  Psychiatric:  Mild anxiety           Assessment & Plan:

## 2019-01-01 ENCOUNTER — Other Ambulatory Visit (INDEPENDENT_AMBULATORY_CARE_PROVIDER_SITE_OTHER): Payer: Managed Care, Other (non HMO)

## 2019-01-01 DIAGNOSIS — L659 Nonscarring hair loss, unspecified: Secondary | ICD-10-CM | POA: Diagnosis not present

## 2019-01-02 LAB — CBC
Hematocrit: 40.9 % (ref 37.5–51.0)
Hemoglobin: 14.1 g/dL (ref 13.0–17.7)
MCH: 31.1 pg (ref 26.6–33.0)
MCHC: 34.5 g/dL (ref 31.5–35.7)
MCV: 90 fL (ref 79–97)
Platelets: 175 10*3/uL (ref 150–450)
RBC: 4.53 x10E6/uL (ref 4.14–5.80)
RDW: 12.3 % (ref 11.6–15.4)
WBC: 3.9 10*3/uL (ref 3.4–10.8)

## 2019-01-02 LAB — COMPREHENSIVE METABOLIC PANEL
ALT: 17 IU/L (ref 0–44)
AST: 20 IU/L (ref 0–40)
Albumin/Globulin Ratio: 2.2 (ref 1.2–2.2)
Albumin: 5 g/dL (ref 4.1–5.2)
Alkaline Phosphatase: 39 IU/L (ref 39–117)
BUN/Creatinine Ratio: 20 (ref 9–20)
BUN: 19 mg/dL (ref 6–20)
Bilirubin Total: 1 mg/dL (ref 0.0–1.2)
CO2: 23 mmol/L (ref 20–29)
Calcium: 9.7 mg/dL (ref 8.7–10.2)
Chloride: 102 mmol/L (ref 96–106)
Creatinine, Ser: 0.97 mg/dL (ref 0.76–1.27)
GFR calc Af Amer: 126 mL/min/{1.73_m2} (ref >59)
GFR calc non Af Amer: 109 mL/min/{1.73_m2} (ref >59)
Globulin, Total: 2.3 g/dL (ref 1.5–4.5)
Glucose: 100 mg/dL — ABNORMAL HIGH (ref 65–99)
Potassium: 4.1 mmol/L (ref 3.5–5.2)
Sodium: 142 mmol/L (ref 134–144)
Total Protein: 7.3 g/dL (ref 6.0–8.5)

## 2019-01-02 LAB — T4, FREE: Free T4: 1.49 ng/dL (ref 0.82–1.77)

## 2019-01-02 LAB — VITAMIN B12: Vitamin B-12: 474 pg/mL (ref 232–1245)

## 2019-01-02 LAB — SEDIMENTATION RATE: Sed Rate: 6 mm/hr (ref 0–15)

## 2019-01-02 LAB — TSH: TSH: 0.71 u[IU]/mL (ref 0.450–4.500)

## 2019-01-07 NOTE — Telephone Encounter (Signed)
Please call to set up in office visit

## 2019-01-08 ENCOUNTER — Ambulatory Visit: Payer: Managed Care, Other (non HMO) | Admitting: Internal Medicine

## 2019-01-08 ENCOUNTER — Other Ambulatory Visit: Payer: Self-pay

## 2019-01-08 ENCOUNTER — Encounter: Payer: Self-pay | Admitting: Internal Medicine

## 2019-01-08 VITALS — BP 100/70 | HR 92 | Temp 98.1°F | Ht 72.5 in | Wt 149.0 lb

## 2019-01-08 DIAGNOSIS — R634 Abnormal weight loss: Secondary | ICD-10-CM

## 2019-01-08 DIAGNOSIS — G43909 Migraine, unspecified, not intractable, without status migrainosus: Secondary | ICD-10-CM | POA: Diagnosis not present

## 2019-01-08 DIAGNOSIS — L659 Nonscarring hair loss, unspecified: Secondary | ICD-10-CM | POA: Diagnosis not present

## 2019-01-08 DIAGNOSIS — M2550 Pain in unspecified joint: Secondary | ICD-10-CM | POA: Diagnosis not present

## 2019-01-08 DIAGNOSIS — F411 Generalized anxiety disorder: Secondary | ICD-10-CM

## 2019-01-08 NOTE — Progress Notes (Signed)
Subjective:    Patient ID: Charles Copeland, male    DOB: 02/14/1996, 23 y.o.   MRN: [id number]  HPI Here due to multiple concerns---hair loss, joint pain, etc  He has concerns and so does his mom He realizes that she has anxiety---and overreacts at times Feels she shouldn't be involved  Having some knee, shoulder, back pains Awoke with bad migraine--yesterday Took it easy yesterday Did have some GI issues ---probably related to migraine and anxiety Ate very lightly--and bland (toast, etc)  Feels good this morning--no joint aches No fever No recent chores or reason for achiness  Had concerns about carpal tunnel  Does note some numbness from palm and out to fingers--to all 5 fingers Seemed to worsen for a few days--then other joint pains. Better today No hand weakness  Current Outpatient Medications on File Prior to Visit  Medication Sig Dispense Refill  . cholecalciferol (VITAMIN D) 1000 UNITS tablet Take 1,000 Units by mouth daily.    Marland Kitchen. ibuprofen (ADVIL,MOTRIN) 200 MG tablet Take 200 mg by mouth every 6 (six) hours as needed.    Marland Kitchen. levocetirizine (XYZAL) 5 MG tablet Take 5 mg by mouth every evening.    . Multiple Vitamin (MULTIVITAMIN WITH MINERALS) TABS Take 1 tablet by mouth daily.    Marland Kitchen. omeprazole (PRILOSEC) 20 MG capsule Take 20 mg by mouth daily.    . propranolol (INDERAL) 10 MG tablet Take 1 tablet (10 mg total) by mouth 3 (three) times daily as needed. For palpitations 30 tablet 1   No current facility-administered medications on file prior to visit.     Allergies  Allergen Reactions  . Lexapro [Escitalopram] Other (See Comments)    Hypotension, syncope  . Remeron [Mirtazapine] Other (See Comments)    Hypotension, passed out  . Geodon [Ziprasidone] Other (See Comments)    Syncope, Hypotension  . Other Hives    Lays Potato Chips  . Seroquel [Quetiapine Fumarate] Swelling    Excessive appetite  Stiff zombie  . Valium [Diazepam]     "Sleeping spells -  confusion"  . Aripiprazole Other (See Comments)    tics    Past Medical History:  Diagnosis Date  . Allergy   . Anxiety   . Asperger's syndrome   . Asthma    as a child  . Depression   . GERD (gastroesophageal reflux disease)   . Mitral valve prolapse   . Pectus excavatum   . Seizures (HCC)    an 2019 - medication caused seizure per pt  . Syncope     Past Surgical History:  Procedure Laterality Date  . nasal cautery  02/09   in OR  . NASAL SEPTUM SURGERY  7/13   [facility]--Dr Evans  . TONSILLECTOMY AND ADENOIDECTOMY  2007    Family History  Problem Relation Age of Onset  . Asthma Mother   . GER disease Mother   . Eczema Mother   . Allergies Mother   . Colon polyps Mother   . Hyperlipidemia Mother   . Hyperlipidemia Father   . Crohn's disease Brother   . Mitral valve prolapse Brother   . Anxiety disorder Brother   . Alcohol abuse Maternal Grandfather        ? bipolar  . Liver cancer Paternal Grandfather        LIVER  . Sarcoidosis Maternal Uncle   . Autoimmune disease Maternal Uncle        hepatitis  . Hepatitis Maternal Aunt  autoimmune  . Graves' disease Maternal Aunt   . Thyroid disease Maternal Aunt   . Sarcoidosis Maternal Aunt   . Esophageal cancer Neg Hx   . Rectal cancer Neg Hx   . Stomach cancer Neg Hx   . Colon cancer Neg Hx     Social History   Socioeconomic History  . Marital status: Single    Spouse name: Not on file  . Number of children: 0  . Years of education: Not on file  . Highest education level: Not on file  Occupational History  . 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 Financial resource strain: Not on file  . Food insecurity:    Worry: Not on file    Inability: Not on file  . Transportation needs:    Medical: Not on file    Non-medical: Not on file  Tobacco Use  . Smoking status: Never Smoker  . Smokeless tobacco: Never Used  Substance and Sexual Activity  . Alcohol use: No    Alcohol/week: 0.0 standard  drinks  . Drug use: No  . Sexual activity: Not on file  Lifestyle  . Physical activity:    Days per week: Not on file    Minutes per session: Not on file  . Stress: Not on file  Relationships  . Social connections:    Talks on phone: Not on file    Gets together: Not on file    Attends religious service: Not on file    Active member of club or organization: Not on file    Attends meetings of clubs or organizations: Not on file    Relationship status: Not on file  . Intimate partner violence:    Fear of current or ex partner: Not on file    Emotionally abused: Not on file    Physically abused: Not on file    Forced sexual activity: Not on file  Other Topics Concern  . Not on file  Social History Narrative   Mother: retired Optometrist   Father: Lab director-[employer]   One brother   No smokers in house   Review of Systems Appetite is "pretty normal" Working on finding foods that don't upset his stomach Has lost a few pounds His mom was more concerned about the hair loss than he was Going back to school ---on line    Objective:   Physical Exam  Constitutional: He is oriented to person, place, and time. No distress.  slim  HENT:  Mouth/Throat: Oropharynx is clear and moist. No oropharyngeal exudate.  Neck: No thyromegaly present.  Cardiovascular: Normal rate, regular rhythm and normal heart sounds. Exam reveals no gallop.  No murmur heard. Respiratory: Effort normal and breath sounds normal. No respiratory distress. He has no wheezes. He has no rales.  GI: Soft. There is no abdominal tenderness.  Musculoskeletal:        General: No tenderness or edema.     Comments: No joint swelling or tenderness  Lymphadenopathy:    He has no cervical adenopathy.  Neurological: He is alert and oriented to person, place, and time. He exhibits normal muscle tone. Coordination normal.  No hand weakness  Skin: No rash noted.  No rash No hair thinning No scalp lesions            Assessment & Plan:

## 2019-01-08 NOTE — Assessment & Plan Note (Signed)
No pathology noted on exam

## 2019-01-08 NOTE — Assessment & Plan Note (Signed)
Intermittent Ibuprofen seems to help No neuro findings

## 2019-01-08 NOTE — Assessment & Plan Note (Signed)
Discussed high calorie density foods like peanut butter, cheese Consider boost or ensure

## 2019-01-08 NOTE — Assessment & Plan Note (Signed)
Transient No evidence of true synovitis Reassured--especially with recent blood work

## 2019-01-08 NOTE — Assessment & Plan Note (Signed)
States his mom thinks he should be back on the fluoxetine He isn't excited about it (seems to respond then think negatively about the Rx) Discussed trying just 2-3 days per week--he will consider this

## 2019-02-14 NOTE — Telephone Encounter (Signed)
Pt called wanting to make in office appt; pt said has no privacy for virtual visit at home. Pt said the spider bite he got on 02/12/19 after taking a walk appears to be getting larger. Pt has some chills andpt had diarrhea but that is a separate matter that he knows what is causing the diarryhea. but no fever,cough,S/T,SOB,muscle pain,h/a, loss of taste or smell, no travel and no known exposure to covid. Pt wants to know if can be seen in office on 02/15/19. Dr Silvio Pate do you want to work pt in as he request or do you want another provider appt offered to pt. UC precautions given for tonight. Please advise.

## 2019-02-15 NOTE — Telephone Encounter (Signed)
Please add him on today if he wants to come in

## 2019-03-01 ENCOUNTER — Ambulatory Visit: Payer: Managed Care, Other (non HMO) | Admitting: Internal Medicine

## 2019-03-01 ENCOUNTER — Encounter: Payer: Self-pay | Admitting: Internal Medicine

## 2019-03-01 ENCOUNTER — Other Ambulatory Visit: Payer: Self-pay

## 2019-03-01 DIAGNOSIS — R590 Localized enlarged lymph nodes: Secondary | ICD-10-CM

## 2019-03-01 NOTE — Assessment & Plan Note (Signed)
Single node that looks completely benign Discussed reactive etiology Observation only

## 2019-03-01 NOTE — Progress Notes (Signed)
Subjective:    Patient ID: Charles Copeland, male    DOB: 01/05/1996, 23 y.o.   MRN: [id number]  HPI Here for evaluation of apparent swollen gland  About 11 days ago, [place]doesn't remember a bite but after felt sick Left low back redness Noticed increased lymph node in left groin the next day Did try hydrocortisone and antibiotic creams  Current Outpatient Medications on File Prior to Visit  Medication Sig Dispense Refill  . cholecalciferol (VITAMIN D) 1000 UNITS tablet Take 1,000 Units by mouth daily.    Marland Kitchen. ibuprofen (ADVIL,MOTRIN) 200 MG tablet Take 200 mg by mouth every 6 (six) hours as needed.    . Multiple Vitamin (MULTIVITAMIN WITH MINERALS) TABS Take 1 tablet by mouth daily.    Marland Kitchen. omeprazole (PRILOSEC) 20 MG capsule Take 20 mg by mouth daily.    . propranolol (INDERAL) 10 MG tablet Take 1 tablet (10 mg total) by mouth 3 (three) times daily as needed. For palpitations 30 tablet 1  . levocetirizine (XYZAL) 5 MG tablet Take 5 mg by mouth every evening.     No current facility-administered medications on file prior to visit.     Allergies  Allergen Reactions  . Lexapro [Escitalopram] Other (See Comments)    Hypotension, syncope  . Remeron [Mirtazapine] Other (See Comments)    Hypotension, passed out  . Geodon [Ziprasidone] Other (See Comments)    Syncope, Hypotension  . Other Hives    Lays Potato Chips  . Seroquel [Quetiapine Fumarate] Swelling    Excessive appetite  Stiff zombie  . Valium [Diazepam]     "Sleeping spells - confusion"  . Aripiprazole Other (See Comments)    tics    Past Medical History:  Diagnosis Date  . Allergy   . Anxiety   . Asperger's syndrome   . Asthma    as a child  . Depression   . GERD (gastroesophageal reflux disease)   . Mitral valve prolapse   . Pectus excavatum   . Seizures (HCC)    an 2019 - medication caused seizure per pt  . Syncope     Past Surgical History:  Procedure Laterality Date  . nasal  cautery  02/09   in OR  . NASAL SEPTUM SURGERY  7/13   [facility]--Dr Evans  . TONSILLECTOMY AND ADENOIDECTOMY  2007    Family History  Problem Relation Age of Onset  . Asthma Mother   . GER disease Mother   . Eczema Mother   . Allergies Mother   . Colon polyps Mother   . Hyperlipidemia Mother   . Hyperlipidemia Father   . Crohn's disease Brother   . Mitral valve prolapse Brother   . Anxiety disorder Brother   . Alcohol abuse Maternal Grandfather        ? bipolar  . Liver cancer Paternal Grandfather        LIVER  . Sarcoidosis Maternal Uncle   . Autoimmune disease Maternal Uncle        hepatitis  . Hepatitis Maternal Aunt        autoimmune  . Graves' disease Maternal Aunt   . Thyroid disease Maternal Aunt   . Sarcoidosis Maternal Aunt   . Esophageal cancer Neg Hx   . Rectal cancer Neg Hx   . Stomach cancer Neg Hx   . Colon cancer Neg Hx     Social History   Socioeconomic History  . Marital status: Single  Spouse name: Not on file  . Number of children: 0  . Years of education: Not on file  . Highest education level: Not on file  Occupational History  . Occupation: Ship broker --[place]  . Financial resource strain: Not on file  . Food insecurity    Worry: Not on file    Inability: Not on file  . Transportation needs    Medical: Not on file    Non-medical: Not on file  Tobacco Use  . Smoking status: Never Smoker  . Smokeless tobacco: Never Used  Substance and Sexual Activity  . Alcohol use: No    Alcohol/week: 0.0 standard drinks  . Drug use: No  . Sexual activity: Not on file  Lifestyle  . Physical activity    Days per week: Not on file    Minutes per session: Not on file  . Stress: Not on file  Relationships  . Social Herbalist on phone: Not on file    Gets together: Not on file    Attends religious service: Not on file    Active member of club or organization: Not on file    Attends meetings of clubs or organizations: Not on  file    Relationship status: Not on file  . Intimate partner violence    Fear of current or ex partner: Not on file    Emotionally abused: Not on file    Physically abused: Not on file    Forced sexual activity: Not on file  Other Topics Concern  . Not on file  Social History Narrative   Mother: retired Lexicographer   Father: Lab director-[employer]   One brother   No smokers in house   Review of Systems  No fever Some abdominal cramping--not necessarily anything new No urinary problems Some palpitations after energy drink--better off this     Objective:   Physical Exam  Lymphadenopathy:  5-74mm freely moveable node in lateral left inguinal chain No redness or tenderness  Skin:  Small skin break with clean eschar--lower left back. No inflammation. Doesn't really look like a bite           Assessment & Plan:

## 2019-04-01 ENCOUNTER — Telehealth: Payer: Self-pay

## 2019-04-01 NOTE — Telephone Encounter (Signed)
Was unable to reach pt by phone. Left v/m requesting cb.

## 2019-04-01 NOTE — Telephone Encounter (Signed)
I also sent mychart message to the patient to have him call us back

## 2019-04-01 NOTE — Telephone Encounter (Signed)
Received a mychart message from patient with the following information from this past weekend: "I just had a white flashing light coming from the bottom of my vision. It started after I entered a dark room. I feel dizzy and sick now. It was flashing very rapidly and felt unsettling. I was outside for a few minutes but not very long. I am trying to get in me more salt and water, but I don't know what to think of this."  I called and left patient a message to call us back to have him triaged.

## 2019-04-02 NOTE — Telephone Encounter (Signed)
Pt having vision problem for few weeks. Pt still having flashing light in bottom of vision field that comes and goes. Pt seeing more floaters in vision as well. pt having more migraine H/A's; no H/A now;pt has issue with ears, feels like water in ears and that causes vertigo which makes the room spin around.nauseated when room spins.no vomiting.Texture of hair has completely changed. Pt has a lot of anxiety. Pt has not spoken with eye doctor about floaters.pt has episodes usually at evening time when pt's entire body shakes; pt not sure if a chill or anxiety.has been going on for 1 1/2 yrs. No other covid symptoms, no travel, no known exposure to + covid. Dr Einar Pheasant recommended pt see opthalmologist for vision issue but Dr Einar Pheasant would see pt for ear problem and dizziness. Pt said he will contact ophthalmologist first and then cb for appt if needed.FYI to Dr Einar Pheasant and Dr Silvio Pate.

## 2019-04-02 NOTE — Telephone Encounter (Signed)
Spoke with receptionist at [facility] and she states when patient called on 03/25/2019 he did not inform them of visual changes. She will send a note to the nurse and they will contact patient about this. I called patient but had to leave a message. I will send mychart message to the patient also.

## 2019-04-02 NOTE — Telephone Encounter (Signed)
Pt left v/m that he could not get appt with Dr Thomasene Ripple opthalmologist until end of Sept. Pt wants to know if can get referral to see Dr Dingledein sooner.pt request cb.

## 2019-04-02 NOTE — Telephone Encounter (Signed)
Would defer to the [place] office on the urgency of the visit. If he communicated his new concerns and they felt September was appropriate then it is likely non-urgent.

## 2019-04-02 NOTE — Telephone Encounter (Signed)
I will call eye doctor after they come back from lunch to make sure patient advised them of changes and make sure that was the time they can see him

## 2019-04-03 NOTE — Telephone Encounter (Signed)
Please follow up with him in the next few days to see if he was seen and how he is doing

## 2019-04-04 NOTE — Telephone Encounter (Signed)
Spoke to pt. He said he has not had any more issues with his eyes. They think it was migraine related. He will take Advil or Aleve if it happens again.

## 2019-04-04 NOTE — Telephone Encounter (Signed)
Called both numbers, left message on cell phone number to call us back for update.

## 2019-04-22 ENCOUNTER — Ambulatory Visit (INDEPENDENT_AMBULATORY_CARE_PROVIDER_SITE_OTHER): Payer: Managed Care, Other (non HMO) | Admitting: Internal Medicine

## 2019-04-22 ENCOUNTER — Encounter: Payer: Self-pay | Admitting: Internal Medicine

## 2019-04-22 DIAGNOSIS — F411 Generalized anxiety disorder: Secondary | ICD-10-CM | POA: Diagnosis not present

## 2019-04-22 NOTE — Progress Notes (Signed)
Subjective:    Patient ID: Charles Copeland, male    DOB: 01/24/1996, 24 y.o.   MRN: [id number]  HPI Virtual visit due to multiple symptoms Identification done Reviewed billing and he gave consent He is at home and I am in my office  Having "stuff going on" Throat is sore for the whole weekend Concerned he had ear infection Has had fluid in his ear--he feels that Gets dizzy--thinks it is related to that Will finally "let loose"--and drain down his throat No ear pain now Wonders about whether the spray in his ear could be causing the problems  Current Outpatient Medications on File Prior to Visit  Medication Sig Dispense Refill  . cholecalciferol (VITAMIN D) 1000 UNITS tablet Take 1,000 Units by mouth daily.    Marland Kitchen. ibuprofen (ADVIL,MOTRIN) 200 MG tablet Take 200 mg by mouth every 6 (six) hours as needed.    . Multiple Vitamin (MULTIVITAMIN WITH MINERALS) TABS Take 1 tablet by mouth daily.    . propranolol (INDERAL) 10 MG tablet Take 1 tablet (10 mg total) by mouth 3 (three) times daily as needed. For palpitations 30 tablet 1   No current facility-administered medications on file prior to visit.     Allergies  Allergen Reactions  . Lexapro [Escitalopram] Other (See Comments)    Hypotension, syncope  . Remeron [Mirtazapine] Other (See Comments)    Hypotension, passed out  . Geodon [Ziprasidone] Other (See Comments)    Syncope, Hypotension  . Other Hives    Lays Potato Chips  . Seroquel [Quetiapine Fumarate] Swelling    Excessive appetite  Stiff zombie  . Valium [Diazepam]     "Sleeping spells - confusion"  . Aripiprazole Other (See Comments)    tics    Past Medical History:  Diagnosis Date  . Allergy   . Anxiety   . Asperger's syndrome   . Asthma    as a child  . Depression   . GERD (gastroesophageal reflux disease)   . Mitral valve prolapse   . Pectus excavatum   . Seizures (HCC)    an 2019 - medication caused seizure per pt  . Syncope     Past Surgical  History:  Procedure Laterality Date  . nasal cautery  02/09   in OR  . NASAL SEPTUM SURGERY  7/13   [facility]--Dr Evans  . TONSILLECTOMY AND ADENOIDECTOMY  2007    Family History  Problem Relation Age of Onset  . Asthma Mother   . GER disease Mother   . Eczema Mother   . Allergies Mother   . Colon polyps Mother   . Hyperlipidemia Mother   . Hyperlipidemia Father   . Crohn's disease Brother   . Mitral valve prolapse Brother   . Anxiety disorder Brother   . Alcohol abuse Maternal Grandfather        ? bipolar  . Liver cancer Paternal Grandfather        LIVER  . Sarcoidosis Maternal Uncle   . Autoimmune disease Maternal Uncle        hepatitis  . Hepatitis Maternal Aunt        autoimmune  . Graves' disease Maternal Aunt   . Thyroid disease Maternal Aunt   . Sarcoidosis Maternal Aunt   . Esophageal cancer Neg Hx   . Rectal cancer Neg Hx   . Stomach cancer Neg Hx   . Colon cancer Neg Hx     Social History   Socioeconomic History  . Marital status:  Single    Spouse name: Not on file  . Number of children: 0  . Years of education: Not on file  . Highest education level: Not on file  Occupational History  . Occupation: Ship broker --[place]  . Financial resource strain: Not on file  . Food insecurity    Worry: Not on file    Inability: Not on file  . Transportation needs    Medical: Not on file    Non-medical: Not on file  Tobacco Use  . Smoking status: Never Smoker  . Smokeless tobacco: Never Used  Substance and Sexual Activity  . Alcohol use: No    Alcohol/week: 0.0 standard drinks  . Drug use: No  . Sexual activity: Not on file  Lifestyle  . Physical activity    Days per week: Not on file    Minutes per session: Not on file  . Stress: Not on file  Relationships  . Social Herbalist on phone: Not on file    Gets together: Not on file    Attends religious service: Not on file    Active member of club or organization: Not on file     Attends meetings of clubs or organizations: Not on file    Relationship status: Not on file  . Intimate partner violence    Fear of current or ex partner: Not on file    Emotionally abused: Not on file    Physically abused: Not on file    Forced sexual activity: Not on file  Other Topics Concern  . Not on file  Social History Narrative   Mother: retired Lexicographer   Father: Lab director-[employer]   One brother   No smokers in house   Review of Systems  Still some headache Just finished school---AD in programming Doesn't feel he can work now---"getting headaches and uptight" Feels he is "going in circles with his thoughts---and it is weird" Did have some flashing lights in his eyes--went to the eye doctor, and no problems found Easily overwhelmed Thinks his hair is different Has been trying to exercise--running, etc     Objective:   Physical Exam  Constitutional: He appears well-developed. No distress.  Psychiatric:  Not really depressed Some ongoing anxiety--distressed about his choices and fear of taking any other medications           Assessment & Plan:

## 2019-04-22 NOTE — Assessment & Plan Note (Signed)
I believe he has a lot of anxiety as well as social issues (diagnosed with autistic spectrum0 Will be having another counseling session (she is in [place]) soon Discussed meditation and he has phone app  Might be worth trying different medication Would try buspirone 5mg  bid if he is willing to try something He will let me know

## 2019-04-22 NOTE — Patient Instructions (Signed)
If you are willing, I would try buspirone 5mg  twice a day for your anxiety. Let me know if you would like to start it and I will send a prescription.  How to help anxiety - without medication.   1) Regular Exercise - walking, jogging, cycling, dancing, strength training   2)  Begin a Mindfulness/Meditation practice -- this can take a little as 3 minutes and is helpful for all kinds of mood issues  -- You can find resources in books  -- Or you can download apps like  ---- Headspace App (which currently has free content called "Weathering the Storm")  ---- Calm (which has a few free options)  ---- Insignt Timer  ---- Stop, Breathe & Think   # With each of these Apps - you should decline the "start free trial" offer and as you search through the App should be able to access some of their free content. You can also chose to pay for the content if you find one that works well for you.   # Many of them also offer sleep specific content which may help with insomnia   3) Healthy Diet  -- Avoid or decrease Caffeine  -- Avoid or decrease Alcohol  -- Drink plenty of water, have a balanced diet  -- Avoid cigarettes and marijuana (as well as other recreational drugs)   4) Consider contacting a professional therapist

## 2019-05-21 ENCOUNTER — Other Ambulatory Visit: Payer: Self-pay

## 2019-05-21 ENCOUNTER — Encounter: Payer: Self-pay | Admitting: Internal Medicine

## 2019-05-21 ENCOUNTER — Ambulatory Visit: Payer: Managed Care, Other (non HMO) | Admitting: Internal Medicine

## 2019-05-21 DIAGNOSIS — M542 Cervicalgia: Secondary | ICD-10-CM | POA: Diagnosis not present

## 2019-05-21 DIAGNOSIS — G8929 Other chronic pain: Secondary | ICD-10-CM | POA: Insufficient documentation

## 2019-05-21 NOTE — Assessment & Plan Note (Signed)
Goes back to before 8th grade---related to pectus (which is not as apparent now but may have slight thoracic scoliosis) No concerning neuro findings Will continue prn ibuprofen Refer for PT Needs to work on strengthening

## 2019-05-21 NOTE — Progress Notes (Signed)
Subjective:    Patient ID: Charles Copeland, male    DOB: 03/07/1996, 23 y.o.   MRN: [id number]  HPI Here due to ongoing neck pain  "I feel like my whoe upper body is screwed up" Neck stays sore--- he thinks this is causing his headaches Has tried stretching--and he notices tensing up on the right Has gone back to 8th grade---had PT then (related to his pectus he feels)  Not able to stay with the gym Has tried exercises at home---actually makes it worse (from year ago PT) Has tried heat--some pain advil ---400mg  intermittently----does help Fairly constant pain--but only takes it once in a while Does affect his sleep  Current Outpatient Medications on File Prior to Visit  Medication Sig Dispense Refill  . cholecalciferol (VITAMIN D) 1000 UNITS tablet Take 1,000 Units by mouth daily.    Marland Kitchen. ibuprofen (ADVIL,MOTRIN) 200 MG tablet Take 200 mg by mouth every 6 (six) hours as needed.    . Multiple Vitamin (MULTIVITAMIN WITH MINERALS) TABS Take 1 tablet by mouth daily.    . propranolol (INDERAL) 10 MG tablet Take 1 tablet (10 mg total) by mouth 3 (three) times daily as needed. For palpitations 30 tablet 1   No current facility-administered medications on file prior to visit.     Allergies  Allergen Reactions  . Lexapro [Escitalopram] Other (See Comments)    Hypotension, syncope  . Remeron [Mirtazapine] Other (See Comments)    Hypotension, passed out  . Geodon [Ziprasidone] Other (See Comments)    Syncope, Hypotension  . Other Hives    Lays Potato Chips  . Seroquel [Quetiapine Fumarate] Swelling    Excessive appetite  Stiff zombie  . Valium [Diazepam]     "Sleeping spells - confusion"  . Aripiprazole Other (See Comments)    tics    Past Medical History:  Diagnosis Date  . Allergy   . Anxiety   . Asperger's syndrome   . Asthma    as a child  . Depression   . GERD (gastroesophageal reflux disease)   . Mitral valve prolapse   . Pectus excavatum   . Seizures (HCC)    an  2019 - medication caused seizure per pt  . Syncope     Past Surgical History:  Procedure Laterality Date  . nasal cautery  02/09   in OR  . NASAL SEPTUM SURGERY  7/13   [facility]--Dr Evans  . TONSILLECTOMY AND ADENOIDECTOMY  2007    Family History  Problem Relation Age of Onset  . Asthma Mother   . GER disease Mother   . Eczema Mother   . Allergies Mother   . Colon polyps Mother   . Hyperlipidemia Mother   . Hyperlipidemia Father   . Crohn's disease Brother   . Mitral valve prolapse Brother   . Anxiety disorder Brother   . Alcohol abuse Maternal Grandfather        ? bipolar  . Liver cancer Paternal Grandfather        LIVER  . Sarcoidosis Maternal Uncle   . Autoimmune disease Maternal Uncle        hepatitis  . Hepatitis Maternal Aunt        autoimmune  . Graves' disease Maternal Aunt   . Thyroid disease Maternal Aunt   . Sarcoidosis Maternal Aunt   . Esophageal cancer Neg Hx   . Rectal cancer Neg Hx   . Stomach cancer Neg Hx   . Colon cancer Neg Hx  Social History   Socioeconomic History  . Marital status: Single    Spouse name: Not on file  . Number of children: 0  . Years of education: Not on file  . Highest education level: Not on file  Occupational History  . 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 Financial resource strain: Not on file  . Food insecurity    Worry: Not on file    Inability: Not on file  . Transportation needs    Medical: Not on file    Non-medical: Not on file  Tobacco Use  . Smoking status: Never Smoker  . Smokeless tobacco: Never Used  Substance and Sexual Activity  . Alcohol use: No    Alcohol/week: 0.0 standard drinks  . Drug use: No  . Sexual activity: Not on file  Lifestyle  . Physical activity    Days per week: Not on file    Minutes per session: Not on file  . Stress: Not on file  Relationships  . Social Herbalist on phone: Not on file    Gets together: Not on file    Attends religious service:  Not on file    Active member of club or organization: Not on file    Attends meetings of clubs or organizations: Not on file    Relationship status: Not on file  . Intimate partner violence    Fear of current or ex partner: Not on file    Emotionally abused: Not on file    Physically abused: Not on file    Forced sexual activity: Not on file  Other Topics Concern  . Not on file  Social History Narrative   Mother: retired Lexicographer   Father: Lab director-[employer]   One brother   No smokers in house   Review of Systems No chiropractor  Has head pain on right side---feels pulling    Objective:   Physical Exam  Constitutional: No distress.  Neck: No thyromegaly present.  No tenderness or spasm Decreased flexion and rotation to the right   Musculoskeletal:     Comments: ?slight thoracic scoliosis to right  Lymphadenopathy:    He has no cervical adenopathy.           Assessment & Plan:

## 2019-06-18 ENCOUNTER — Ambulatory Visit: Payer: Managed Care, Other (non HMO)

## 2019-06-20 ENCOUNTER — Ambulatory Visit (INDEPENDENT_AMBULATORY_CARE_PROVIDER_SITE_OTHER): Payer: Managed Care, Other (non HMO)

## 2019-06-20 DIAGNOSIS — Z23 Encounter for immunization: Secondary | ICD-10-CM

## 2019-07-12 ENCOUNTER — Ambulatory Visit: Payer: Managed Care, Other (non HMO) | Admitting: Family Medicine

## 2019-07-12 ENCOUNTER — Ambulatory Visit: Payer: Managed Care, Other (non HMO) | Admitting: Internal Medicine

## 2019-07-12 ENCOUNTER — Encounter: Payer: Self-pay | Admitting: Family Medicine

## 2019-07-12 ENCOUNTER — Other Ambulatory Visit: Payer: Self-pay

## 2019-07-12 DIAGNOSIS — R590 Localized enlarged lymph nodes: Secondary | ICD-10-CM | POA: Diagnosis not present

## 2019-07-12 NOTE — Patient Instructions (Signed)
Exam ok today. Possible swollen gland from fungal infection - continue antifungal, let us know if not resolving with this.

## 2019-07-12 NOTE — Assessment & Plan Note (Addendum)
This seems largely resolved. Reassurance provided.  Endorses groin rash that comes and goes. Treating effectively with antifungal cream. LAD could come from this too

## 2019-07-12 NOTE — Progress Notes (Signed)
This visit was conducted in person.  BP 118/70 (BP Location: Left Arm, Patient Position: Sitting, Cuff Size: Normal)   Pulse 81   Temp 98.1 F (36.7 C) (Temporal)   Ht 5' 9.75" (1.772 m)   Wt 145 lb (65.8 kg)   SpO2 99%   BMI 20.95 kg/m    CC: groin swelling Subjective:    Patient ID: Charles Copeland, male    DOB: Dec 26, 1995, 23 y.o.   MRN: [id number]  HPI: Charles Copeland is a 23 y.o. male presenting on 07/12/2019 for Insect Bite (Received fly bite on low left back about 3 mos ago affecting lymph node on anterior on upper left leg.  Area had improved but now has some swelling.  Denies any pain. )   3 months ago bit by fly on left lower back. Felt ill for 1 day. Had L inguinal LAD from this that did resolve. This past week noticed recurrent swelling at L groin. Not tender, no redness or warmth to the area. Notes new rash in groin that comes and goes. Treating with anti-fungal cream.   No fevers/chills, no dysuria.  Not currently sexually active.  Currently struggling with allergies.      Relevant past medical, surgical, family and social history reviewed and updated as indicated. Interim medical history since our last visit reviewed. Allergies and medications reviewed and updated. Outpatient Medications Prior to Visit  Medication Sig Dispense Refill  . cholecalciferol (VITAMIN D) 1000 UNITS tablet Take 1,000 Units by mouth daily.    Marland Kitchen ibuprofen (ADVIL,MOTRIN) 200 MG tablet Take 200 mg by mouth every 6 (six) hours as needed.    . Multiple Vitamin (MULTIVITAMIN WITH MINERALS) TABS Take 1 tablet by mouth daily.    . propranolol (INDERAL) 10 MG tablet Take 1 tablet (10 mg total) by mouth 3 (three) times daily as needed. For palpitations 30 tablet 1   No facility-administered medications prior to visit.      Per HPI unless specifically indicated in ROS section below Review of Systems Objective:    BP 118/70 (BP Location: Left Arm, Patient Position: Sitting, Cuff Size: Normal)    Pulse 81   Temp 98.1 F (36.7 C) (Temporal)   Ht 5' 9.75" (1.772 m)   Wt 145 lb (65.8 kg)   SpO2 99%   BMI 20.95 kg/m   Wt Readings from Last 3 Encounters:  07/12/19 145 lb (65.8 kg)  05/21/19 143 lb (64.9 kg)  04/22/19 144 lb (65.3 kg)    Physical Exam Vitals signs and nursing note reviewed.  Constitutional:      General: He is not in acute distress.    Appearance: Normal appearance. He is not ill-appearing.  Genitourinary:    Pubic Area: No rash.      Comments: No appreciable groin swelling or inguinal bulge or lymphadenopathy appreciated Skin:    General: Skin is warm and dry.     Findings: No rash.     Comments: No lesion or scarring on lower back   Neurological:     Mental Status: He is alert.       Assessment & Plan:   Problem List Items Addressed This Visit    Lymphadenopathy, inguinal    This seems largely resolved. Reassurance provided.  Endorses groin rash that comes and goes. Treating effectively with antifungal cream. LAD could come from this too          No orders of the defined types were placed in this encounter.  No orders of the defined types were placed in this encounter.   Follow up plan: No follow-ups on file.  Charles Boyden, MD

## 2019-07-17 ENCOUNTER — Ambulatory Visit: Payer: Managed Care, Other (non HMO) | Admitting: Internal Medicine

## 2019-07-22 ENCOUNTER — Other Ambulatory Visit: Payer: Self-pay

## 2019-07-22 ENCOUNTER — Encounter: Payer: Self-pay | Admitting: Internal Medicine

## 2019-07-22 ENCOUNTER — Ambulatory Visit: Payer: Managed Care, Other (non HMO) | Admitting: Internal Medicine

## 2019-07-22 DIAGNOSIS — R1909 Other intra-abdominal and pelvic swelling, mass and lump: Secondary | ICD-10-CM

## 2019-07-22 NOTE — Assessment & Plan Note (Signed)
Has normal sized nodes but the area of his concern seems to be a normal inguinal ligament Reassured

## 2019-07-22 NOTE — Progress Notes (Signed)
Subjective:    Patient ID: Charles Copeland, male    DOB: 11/06/1995, 23 y.o.   MRN: [id number]  HPI Here due to concerns for persistent swelling in left groin  Seems like "something is jutting out" Points to lateral inguinal area on left Was sore a while back No longer sore Thought it was related to insect bite months ago---had felt dizzy and nauseated at that time  No Rx for this  Current Outpatient Medications on File Prior to Visit  Medication Sig Dispense Refill  . ibuprofen (ADVIL,MOTRIN) 200 MG tablet Take 200 mg by mouth every 6 (six) hours as needed.    . propranolol (INDERAL) 10 MG tablet Take 1 tablet (10 mg total) by mouth 3 (three) times daily as needed. For palpitations 30 tablet 1  . cholecalciferol (VITAMIN D) 1000 UNITS tablet Take 1,000 Units by mouth daily.    . Multiple Vitamin (MULTIVITAMIN WITH MINERALS) TABS Take 1 tablet by mouth daily.     No current facility-administered medications on file prior to visit.     Allergies  Allergen Reactions  . Lexapro [Escitalopram] Other (See Comments)    Hypotension, syncope  . Remeron [Mirtazapine] Other (See Comments)    Hypotension, passed out  . Geodon [Ziprasidone] Other (See Comments)    Syncope, Hypotension  . Other Hives    Lays Potato Chips  . Seroquel [Quetiapine Fumarate] Swelling    Excessive appetite  Stiff zombie  . Valium [Diazepam]     "Sleeping spells - confusion"  . Aripiprazole Other (See Comments)    tics    Past Medical History:  Diagnosis Date  . Allergy   . Anxiety   . Asperger's syndrome   . Asthma    as a child  . Depression   . GERD (gastroesophageal reflux disease)   . Mitral valve prolapse   . Pectus excavatum   . Seizures ([facility])    an 2019 - medication caused seizure per pt  . Syncope     Past Surgical History:  Procedure Laterality Date  . nasal cautery  02/09   in OR  . NASAL SEPTUM SURGERY  7/13   [facility]--Dr Evans  . TONSILLECTOMY AND ADENOIDECTOMY  2007    Family History  Problem Relation Age of Onset  . Asthma Mother   . GER disease Mother   . Eczema Mother   . Allergies Mother   . Colon polyps Mother   . Hyperlipidemia Mother   . Hyperlipidemia Father   . Crohn's disease Brother   . Mitral valve prolapse Brother   . Anxiety disorder Brother   . Alcohol abuse Maternal Grandfather        ? bipolar  . Liver cancer Paternal Grandfather        LIVER  . Sarcoidosis Maternal Uncle   . Autoimmune disease Maternal Uncle        hepatitis  . Hepatitis Maternal Aunt        autoimmune  . Graves' disease Maternal Aunt   . Thyroid disease Maternal Aunt   . Sarcoidosis Maternal Aunt   . Esophageal cancer Neg Hx   . Rectal cancer Neg Hx   . Stomach cancer Neg Hx   . Colon cancer Neg Hx     Social History   Socioeconomic History  . Marital status: Single    Spouse name: Not on file  . Number of children: 0  . Years of education: Not on file  . Highest education level: Not  on file  Occupational History  . Occupation: Ship broker --[place]  . Financial resource strain: Not on file  . Food insecurity    Worry: Not on file    Inability: Not on file  . Transportation needs    Medical: Not on file    Non-medical: Not on file  Tobacco Use  . Smoking status: Never Smoker  . Smokeless tobacco: Never Used  Substance and Sexual Activity  . Alcohol use: No    Alcohol/week: 0.0 standard drinks  . Drug use: No  . Sexual activity: Not on file  Lifestyle  . Physical activity    Days per week: Not on file    Minutes per session: Not on file  . Stress: Not on file  Relationships  . Social Herbalist on phone: Not on file    Gets together: Not on file    Attends religious service: Not on file    Active member of club or organization: Not on file    Attends meetings of clubs or organizations: Not on file    Relationship status: Not on file  . Intimate partner violence    Fear of current or ex partner: Not on file     Emotionally abused: Not on file    Physically abused: Not on file    Forced sexual activity: Not on file  Other Topics Concern  . Not on file  Social History Narrative   Mother: retired Lexicographer   Father: Lab director-[employer]   One brother   No smokers in house   Review of Systems No fever Bowels are fine No urinary symptoms Some insomnia Still wants to go back to school---trying to set this up (wants modifications based on ASD diagnosis---which hasn't been confirmed)    Objective:   Physical Exam  GI:  Palpable tiny (normal) inguinal nodes in lateral right groin His concern seems to be over the ligament---which feels normal           Assessment & Plan:

## 2019-07-22 NOTE — Telephone Encounter (Signed)
Seen in the office this morning

## 2019-08-12 DIAGNOSIS — M542 Cervicalgia: Secondary | ICD-10-CM

## 2019-08-13 NOTE — Telephone Encounter (Signed)
Rosaria Ferries, I think he had recent PT. Is he eligible to go back to [facility] for more?

## 2019-08-13 NOTE — Telephone Encounter (Signed)
Great, thanks

## 2019-09-05 NOTE — Telephone Encounter (Signed)
Called pt and gave him the number [phone]. Pt said he does not think he needs it right now. Does not want to leave home. Says they are working on things.

## 2019-09-05 NOTE — Telephone Encounter (Signed)
I will respond to him but please call and give him the number for the [facility] medicine crisis line

## 2019-11-02 NOTE — Telephone Encounter (Signed)
Sending to Dr Alphonsus Sias as Lorain Childes  Sending to [facility] to follow up Monday.   Pt given instructions for UC/ED with worsening symptoms or development of chest pains/SOB. Pt advised to call Monday for an appointment with one of the Providers - explained Dr Alphonsus Sias is out of the office until March 8th.

## 2019-11-04 NOTE — Telephone Encounter (Signed)
I will wait for Dr Alphonsus Sias to respond. These are ongoing issues that Dr Alphonsus Sias may understand that other providers would not.

## 2019-12-03 ENCOUNTER — Encounter: Payer: Self-pay | Admitting: Internal Medicine

## 2019-12-03 ENCOUNTER — Ambulatory Visit: Payer: Managed Care, Other (non HMO) | Admitting: Internal Medicine

## 2019-12-03 ENCOUNTER — Other Ambulatory Visit: Payer: Self-pay

## 2019-12-03 DIAGNOSIS — J301 Allergic rhinitis due to pollen: Secondary | ICD-10-CM | POA: Diagnosis not present

## 2019-12-03 DIAGNOSIS — F411 Generalized anxiety disorder: Secondary | ICD-10-CM | POA: Diagnosis not present

## 2019-12-03 NOTE — Patient Instructions (Signed)
Please try over the counter loratadine 10mg  daily for your allergies. You can add a second tab daily if that isn't effective. You can try the fluticasone nasal spray again as well if needed. Just try 1 spray in each nostril daily--but you can increase to 2 sprays if you tolerate. Please make an appointment with the [facility] at [facility] for some advice about job opportunities.

## 2019-12-03 NOTE — Progress Notes (Signed)
Subjective:    Patient ID: Charles Copeland, male    DOB: 1995-11-28, 24 y.o.   MRN: [id number]  HPI Here for follow up of anxiety and concern about allergies This visit occurred during the SARS-CoV-2 public health emergency.  Safety protocols were in place, including screening questions prior to the visit, additional usage of staff PPE, and extensive cleaning of exam room while observing appropriate contact time as indicated for disinfecting solutions.   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, etc  Allergy season has started Having runny nose Didn't think levocetirizine was tolerated well---"makes me feel weird" but does control his allergies Did use flonase last year--thought it bothered his ears Did use claritin daily in the past---not sure it helped much  Anxiety acting up this weekend--felt the antihistamine might have worsened (so he stopped it)  Really "frustrated with my living situation" On "pins and needles all the time"--trying not to "aggravate my mother" He can't get away--would love his own place but no reasonable alternative  Doesn't feel he will be able to finish Bachelor's degree Does have Associates degree Feels he needs a job "where it is quiet" and not a lot of people  Current Outpatient Medications on File Prior to Visit  Medication Sig Dispense Refill  . ibuprofen (ADVIL,MOTRIN) 200 MG tablet Take 200 mg by mouth every 6 (six) hours as needed.    . propranolol (INDERAL) 10 MG tablet Take 1 tablet (10 mg total) by mouth 3 (three) times daily as needed. For palpitations 30 tablet 1   No current facility-administered medications on file prior to visit.    Allergies  Allergen Reactions  . Lexapro [Escitalopram] Other (See Comments)    Hypotension, syncope  . Remeron [Mirtazapine] Other (See Comments)    Hypotension, passed out  . Geodon [Ziprasidone] Other (See Comments)    Syncope, Hypotension  . Other Hives    Lays Potato Chips    . Seroquel [Quetiapine Fumarate] Swelling    Excessive appetite  Stiff zombie  . Valium [Diazepam]     "Sleeping spells - confusion"  . Aripiprazole Other (See Comments)    tics    Past Medical History:  Diagnosis Date  . Allergy   . Anxiety   . Asperger's syndrome   . Asthma    as a child  . Depression   . GERD (gastroesophageal reflux disease)   . Mitral valve prolapse   . Pectus excavatum   . Seizures ([place])    an 2019 - medication caused seizure per pt  . Syncope     Past Surgical History:  Procedure Laterality Date  . nasal cautery  02/09   in OR  . NASAL SEPTUM SURGERY  7/13   [facility]--Dr Evans  . TONSILLECTOMY AND ADENOIDECTOMY  2007    Family History  Problem Relation Age of Onset  . Asthma Mother   . GER disease Mother   . Eczema Mother   . Allergies Mother   . Colon polyps Mother   . Hyperlipidemia Mother   . Hyperlipidemia Father   . Crohn's disease Brother   . Mitral valve prolapse Brother   . Anxiety disorder Brother   . Alcohol abuse Maternal Grandfather        ? bipolar  . Liver cancer Paternal Grandfather        LIVER  . Sarcoidosis Maternal Uncle   . Autoimmune disease Maternal Uncle        hepatitis  . Hepatitis Maternal  Aunt        autoimmune  . Graves' disease Maternal Aunt   . Thyroid disease Maternal Aunt   . Sarcoidosis Maternal Aunt   . Esophageal cancer Neg Hx   . Rectal cancer Neg Hx   . Stomach cancer Neg Hx   . Colon cancer Neg Hx     Social History   Socioeconomic History  . Marital status: Single    Spouse name: Not on file  . Number of children: 0  . Years of education: Not on file  . Highest education level: Not on file  Occupational History  . Occupation: Student --[employer]  Tobacco Use  . Smoking status: Never Smoker  . Smokeless tobacco: Never Used  Substance and Sexual Activity  . Alcohol use: No    Alcohol/week: 0.0 standard drinks  . Drug use: No  . Sexual activity: Not on file  Other Topics Concern   . Not on file  Social History Narrative   Mother: retired Optometrist   Father: Lab director-[employer]   One brother   No smokers in house   Social Determinants of Health   Financial Resource Strain:   . Difficulty of Paying Living Expenses:   Food Insecurity:   . Worried About Programme researcher, broadcasting/film/video in the Last Year:   . Barista in the Last Year:   Transportation Needs:   . Freight forwarder (Medical):   Marland Kitchen Lack of Transportation (Non-Medical):   Physical Activity:   . Days of Exercise per Week:   . Minutes of Exercise per Session:   Stress:   . Feeling of Stress :   Social Connections:   . Frequency of Communication with Friends and Family:   . Frequency of Social Gatherings with Friends and Family:   . Attends Religious Services:   . Active Member of Clubs or Organizations:   . Attends Banker Meetings:   Marland Kitchen Marital Status:   Intimate Partner Violence:   . Fear of Current or Ex-Partner:   . Emotionally Abused:   Marland Kitchen Physically Abused:   . Sexually Abused:    Review of Systems  Does get out for walks, etc Sleep is not great--- irregular hours. Not consistent with wake up time Appetite is okay Weight is stable at a good level Is trying to touch base with autism society for help      Objective:   Physical Exam  Constitutional: He appears well-developed. No distress.  Psychiatric:  Calm and more insight than he usually demonstrates Not depressed           Assessment & Plan:

## 2019-12-03 NOTE — Assessment & Plan Note (Signed)
Actually some better now Still focused issues---knows he has trouble with school and limited options Discussed vocational counseling---should seek assistance from placement center at the community [facility] and autism society

## 2019-12-03 NOTE — Assessment & Plan Note (Signed)
Discussed options (didn't tolerate xyzal) benedryl makes him loopy

## 2019-12-05 ENCOUNTER — Telehealth: Payer: Self-pay

## 2019-12-05 NOTE — Telephone Encounter (Signed)
Pt's mom called and left a message on triage line. I called back to talk to her.  He is talking to his therapist on the phone right now.   She said he has been doing pretty good with the recent diagnosis of Autism. He had a break today. He dropped his classes and broke his glasses. He has settled down.  Asking if Dr Alphonsus Sias had any ideas of where he can go to get some guidance on how to do his daily life with Autism. She has looked in to the [facility] program for DBT training. Dr Marline Backbone runs the program. Wondered if Dr Alphonsus Sias did a call or a referral to them to see about getting him in the DBT Program. They do employment placement as well as everyday coping skills. Their number is [phone].

## 2019-12-08 NOTE — Telephone Encounter (Signed)
Please find out where this program is and if a physician referral is needed (I certainly would make a referral if needed)

## 2019-12-09 NOTE — Telephone Encounter (Signed)
It is at [facility] and the number is [phone]. The referral would need to be for the DBT Program. She thinks he can get in quicker with a referral from [place].

## 2019-12-10 NOTE — Telephone Encounter (Signed)
Please let his mom know that he has informed me that he is getting another doctor---so I am not going to go forward with this referral.

## 2019-12-10 NOTE — Telephone Encounter (Signed)
Left message for mom to call back 

## 2019-12-10 NOTE — Telephone Encounter (Signed)
Patient's mom returned call Advised of message below from Dr Alphonsus Sias and Earleen Reaper message received from patient.   Mom voiced understanding

## 2020-01-02 ENCOUNTER — Telehealth: Payer: Self-pay | Admitting: Internal Medicine

## 2020-01-02 NOTE — Telephone Encounter (Signed)
Spoke to person at [facility]. She was unaware Dr Alphonsus Sias was no longer his PCP. She said she had called and spoke to Reuel Boom and he made her aware Dr Alphonsus Sias was no longer his provider. She had called him to offer an appt tomorrow to start, but that he needed a referral. I advised her that Dr Alphonsus Sias will not be doing the referral. He will have to wait for the new provider.

## 2020-01-02 NOTE — Telephone Encounter (Signed)
Yes---he fired me, not the other way around. And some of it was related to this referral--I am unclear if he wanted it or just his mother. Let them know they will have to wait for the new physician for this order (and confirm he wants it)

## 2020-01-02 NOTE — Telephone Encounter (Signed)
[facility] called and they said the pt needs a referral so he can begin occupational therapy with them. They need it by 5pm today and they will fax over a form to be completed by Dr. Alphonsus Sias and sent back. I let them know Dr. Alphonsus Sias would not be in until this afternoon but I will send a message so we can be on the lookout for the form.   Their phone number [phone] Fax number [phone]

## 2020-01-02 NOTE — Telephone Encounter (Signed)
Pt informed us through MyChart message 12/07/19 that he was seeking a new physician. Looks like he has been scheduled with Einar Crow at [facility] 01-27-20. Will forward to Dr Alphonsus Sias to decide.

## 2020-01-04 IMAGING — DX DG CHEST 2V
2 series · 2 of 2 positions shown · non-contrast
Comparison: 11/18/2012.

CLINICAL DATA: Syncope.  Shortness of breath.  Chest pain.

EXAM:
CHEST  2 VIEW

[chest pa]
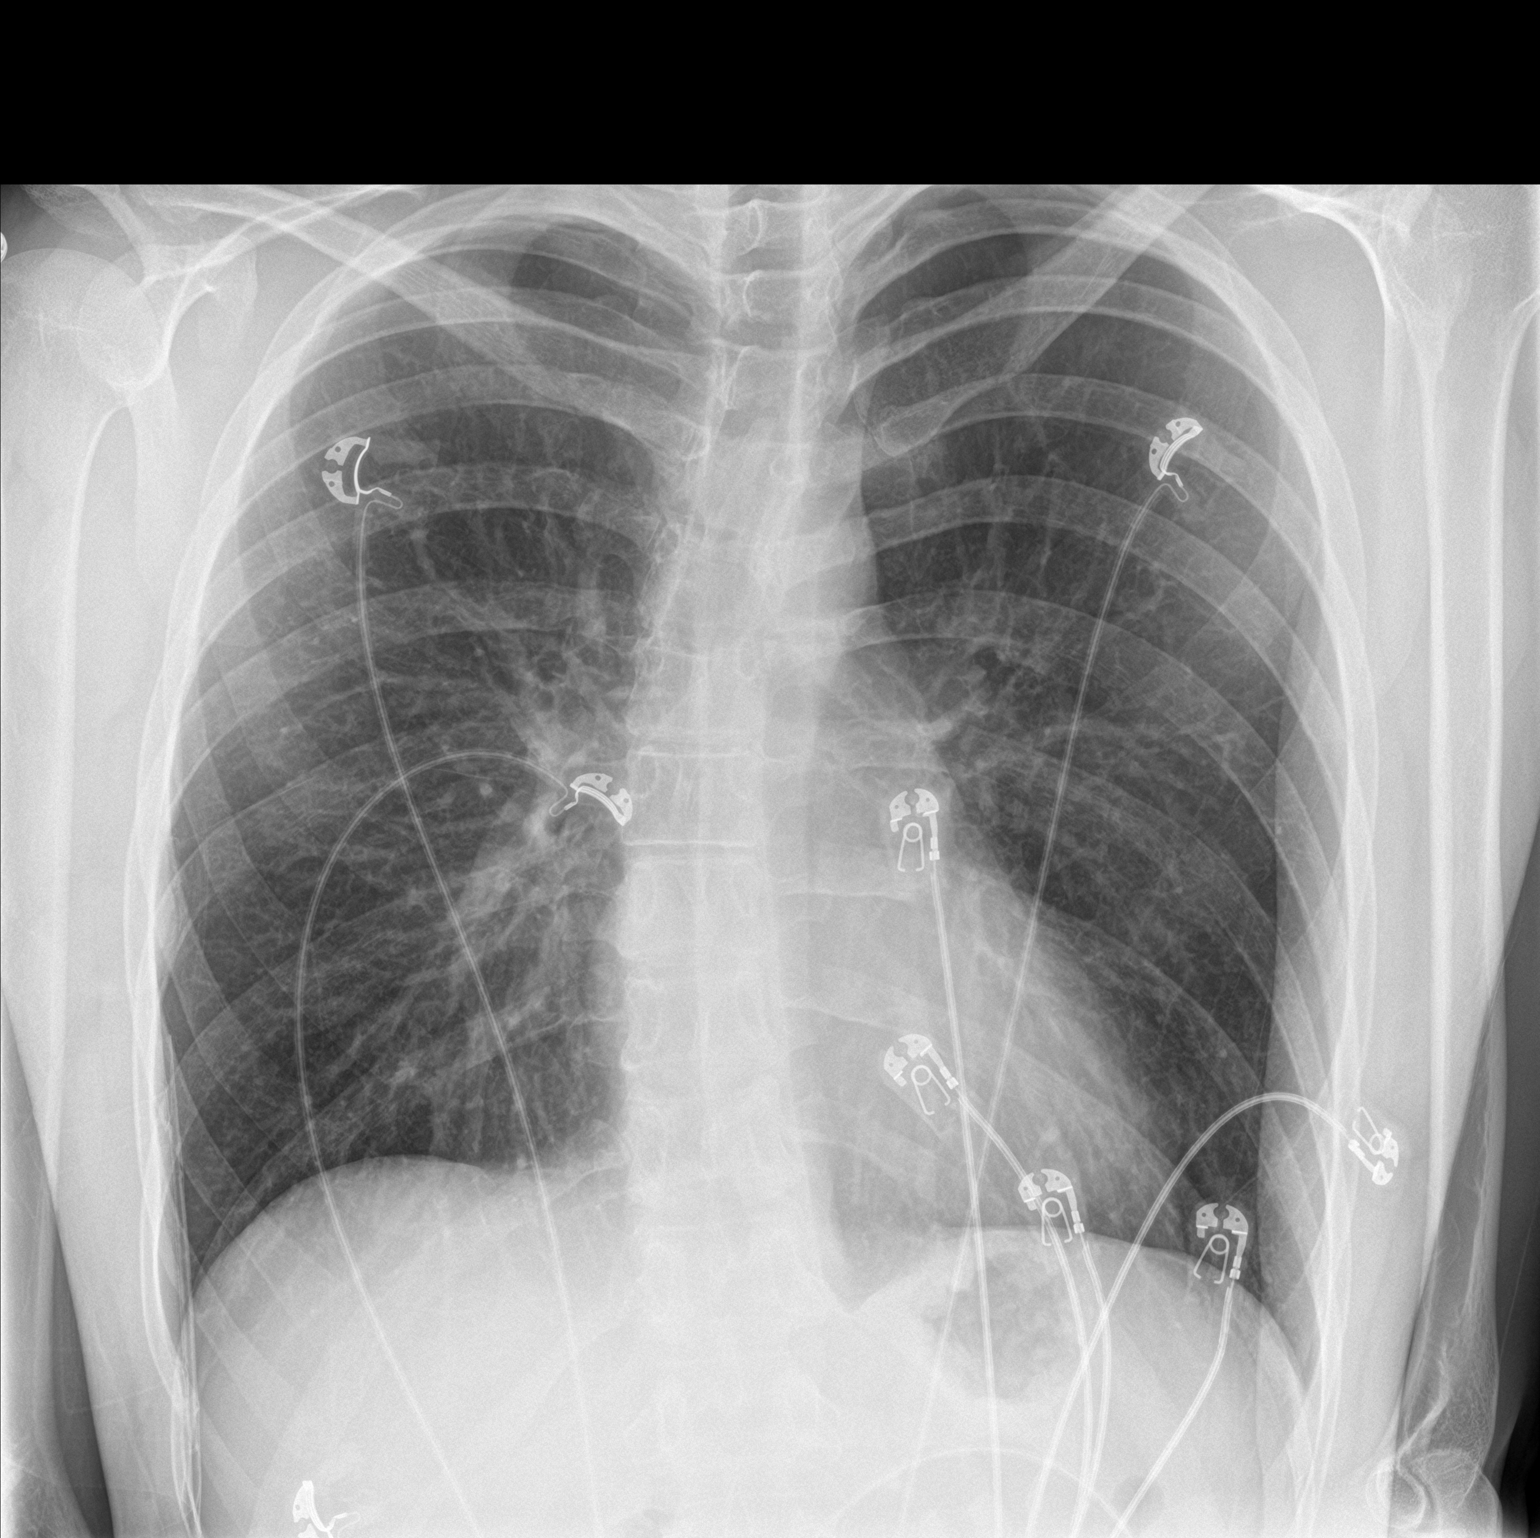

[chest lat]
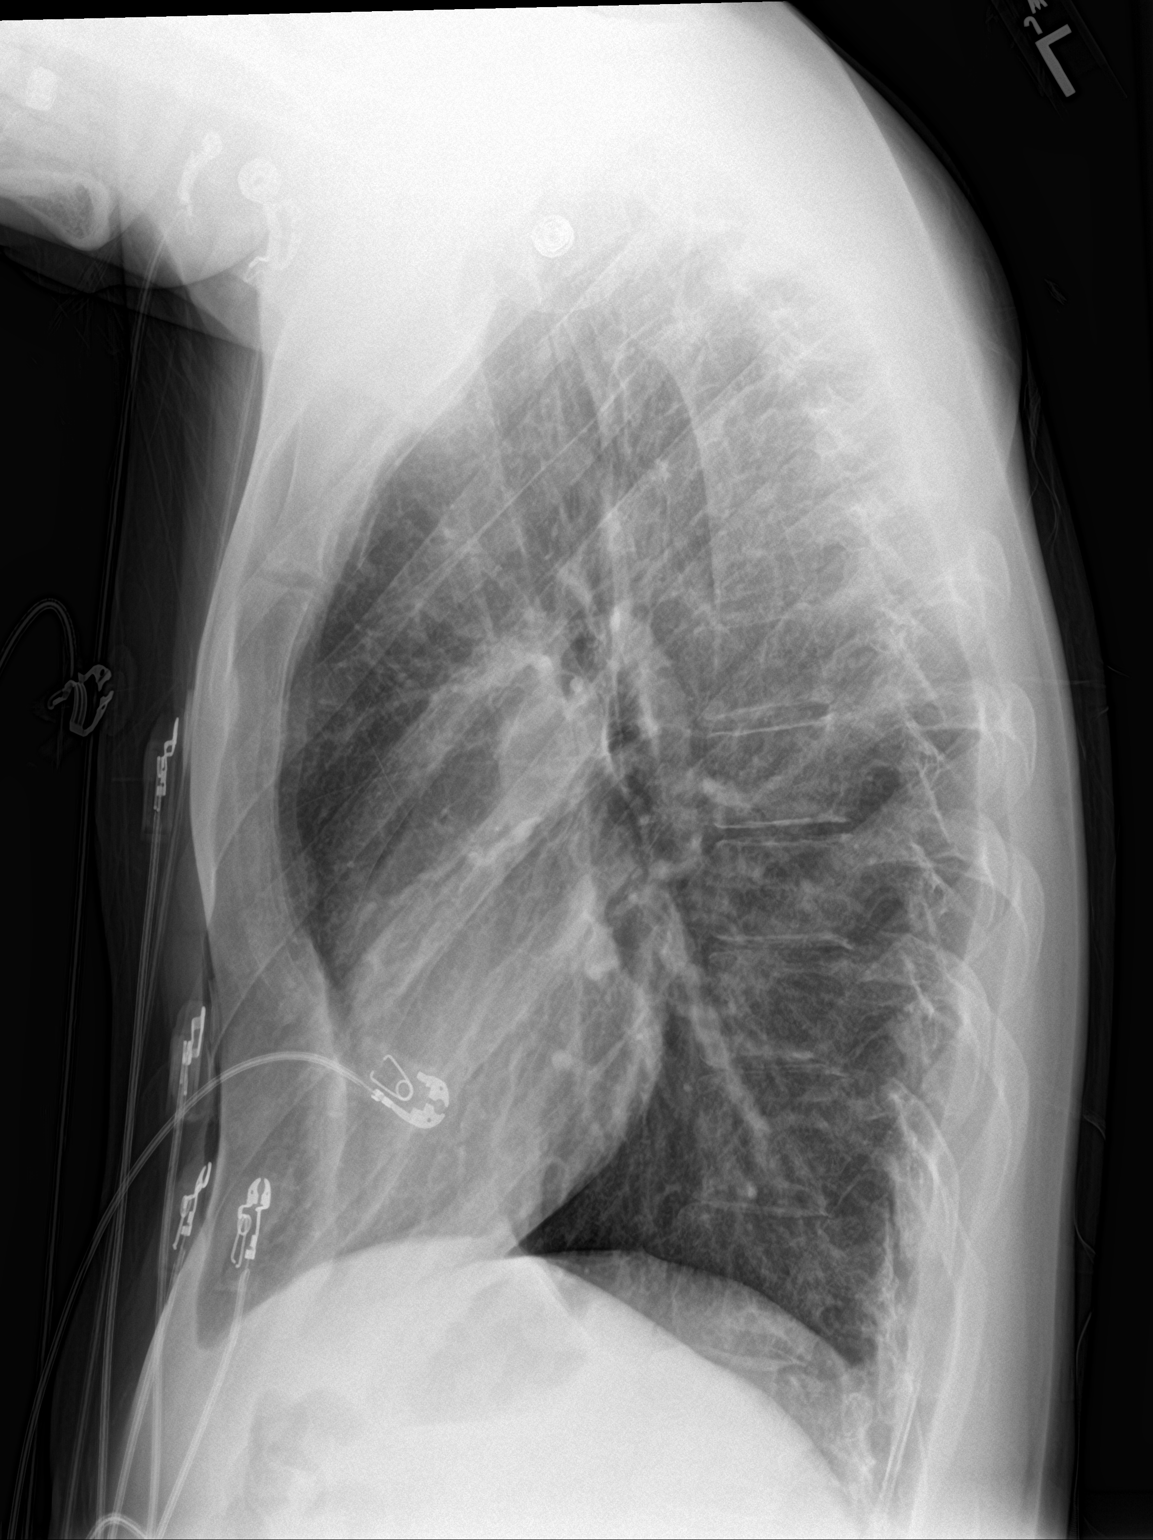

[2 of 2 positions shown; findings below may reference images not displayed]

FINDINGS: Normal sized heart. Clear lungs with normal vascularity. Mild
scoliosis.
IMPRESSION: No acute abnormality.

## 2020-09-01 ENCOUNTER — Emergency Department (HOSPITAL_COMMUNITY)
Admission: EM | Admit: 2020-09-01 | Discharge: 2020-09-02 | Disposition: A | Discharge status: Home / Self Care | Payer: Managed Care, Other (non HMO) | Attending: Emergency Medicine | Admitting: Emergency Medicine

## 2020-09-01 ENCOUNTER — Other Ambulatory Visit: Payer: Self-pay

## 2020-09-01 ENCOUNTER — Encounter (HOSPITAL_COMMUNITY): Payer: Self-pay

## 2020-09-01 DIAGNOSIS — Z046 Encounter for general psychiatric examination, requested by authority: Secondary | ICD-10-CM | POA: Diagnosis present

## 2020-09-01 DIAGNOSIS — J45909 Unspecified asthma, uncomplicated: Secondary | ICD-10-CM | POA: Diagnosis not present

## 2020-09-01 DIAGNOSIS — F4320 Adjustment disorder, unspecified: Secondary | ICD-10-CM

## 2020-09-01 DIAGNOSIS — F840 Autistic disorder: Secondary | ICD-10-CM | POA: Insufficient documentation

## 2020-09-01 DIAGNOSIS — Z20822 Contact with and (suspected) exposure to covid-19: Secondary | ICD-10-CM | POA: Insufficient documentation

## 2020-09-01 LAB — CBC WITH DIFFERENTIAL/PLATELET
Abs Immature Granulocytes: 0.01 10*3/uL (ref 0.00–0.07)
Basophils Absolute: 0 10*3/uL (ref 0.0–0.1)
Basophils Relative: 1 %
Eosinophils Absolute: 0.2 10*3/uL (ref 0.0–0.5)
Eosinophils Relative: 5 %
HCT: 43.1 % (ref 39.0–52.0)
Hemoglobin: 14.4 g/dL (ref 13.0–17.0)
Immature Granulocytes: 0 %
Lymphocytes Relative: 26 %
Lymphs Abs: 0.9 10*3/uL (ref 0.7–4.0)
MCH: 31.4 pg (ref 26.0–34.0)
MCHC: 33.4 g/dL (ref 30.0–36.0)
MCV: 93.9 fL (ref 80.0–100.0)
Monocytes Absolute: 0.5 10*3/uL (ref 0.1–1.0)
Monocytes Relative: 15 %
Neutro Abs: 1.9 10*3/uL (ref 1.7–7.7)
Neutrophils Relative %: 53 %
Platelets: 156 10*3/uL (ref 150–400)
RBC: 4.59 MIL/uL (ref 4.22–5.81)
RDW: 12.7 % (ref 11.5–15.5)
WBC: 3.5 10*3/uL — ABNORMAL LOW (ref 4.0–10.5)
nRBC: 0 % (ref 0.0–0.2)

## 2020-09-01 LAB — RAPID URINE DRUG SCREEN, HOSP PERFORMED
Amphetamines: NOT DETECTED
Barbiturates: NOT DETECTED
Benzodiazepines: NOT DETECTED
Cocaine: NOT DETECTED
Opiates: NOT DETECTED
Tetrahydrocannabinol: NOT DETECTED

## 2020-09-01 LAB — BASIC METABOLIC PANEL
Anion gap: 9 (ref 5–15)
BUN: 17 mg/dL (ref 6–20)
CO2: 28 mmol/L (ref 22–32)
Calcium: 9.6 mg/dL (ref 8.9–10.3)
Chloride: 103 mmol/L (ref 98–111)
Creatinine, Ser: 0.88 mg/dL (ref 0.61–1.24)
GFR, Estimated: >60 mL/min (ref >60)
Glucose, Bld: 92 mg/dL (ref 70–99)
Potassium: 3.8 mmol/L (ref 3.5–5.1)
Sodium: 140 mmol/L (ref 135–145)

## 2020-09-01 LAB — URINALYSIS, ROUTINE W REFLEX MICROSCOPIC
Bilirubin Urine: NEGATIVE
Glucose, UA: NEGATIVE mg/dL
Hgb urine dipstick: NEGATIVE
Ketones, ur: 20 mg/dL — AB
Leukocytes,Ua: NEGATIVE
Nitrite: NEGATIVE
Protein, ur: NEGATIVE mg/dL
Specific Gravity, Urine: 1.02 (ref 1.005–1.030)
pH: 5 (ref 5.0–8.0)

## 2020-09-01 LAB — ETHANOL: Alcohol, Ethyl (B): <10 mg/dL (ref <10)
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
## 2020-09-02 NOTE — [facility] Assessment (Signed)
[facility] Assessment Progress Note  Per Nira Conn, NP, this pt does not require psychiatric hospitalization at this time.  Pt presents under IVC initiated by pt's mother which has been rescinded by EDP Mancel Bale, MD.  Pt is psychiatrically cleared.  Pt has been instructed to continue treatment with his current outpatient provider.  Doylene Canning, MA Triage Specialist [phone]

## 2020-09-02 NOTE — ED Notes (Signed)
Pt searched by security and changed into hospital provided burgundy scrubs. Belongings gathered and verified by patient.
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
## 2020-09-02 NOTE — ED Notes (Signed)
Pt in bed, calm and cooperative

## 2020-09-02 NOTE — ED Provider Notes (Signed)
8:55 AM-patient has been seen by TTS who feel he is stable for discharge. See their disposition plan:  Disposition: Nira Conn, NP, recommends for patient to be psych cleared. He is ok to discharge to the care of [facility]. Patient to follow up with current outpatient provider for therapy ([facility]). He is also to follow up with his new [facility] in [place], [place]. Maralyn Sago, RN and Dr. Judd Lien both notified of patient's disposition.     At this time the patient is alert and cooperative. I have rescinded his IVC.   Mancel Bale, MD 09/02/20 [phone]

## 2020-09-02 NOTE — ED Notes (Signed)
Pt in room 17 waiting on TTS

## 2020-09-02 NOTE — Discharge Instructions (Addendum)
Follow-up with your usual care providers for ongoing management of medical and psychiatric issues.

## 2020-12-31 ENCOUNTER — Telehealth: Payer: Self-pay

## 2020-12-31 NOTE — Telephone Encounter (Signed)
Called patient to get more information. He states he is wanting Dr. Alphonsus Sias to call him. He states he has questions but would not go into detail when I tried getting more information. He states he has transferred to another provider, but it is pertaining to topics from when Dr. Alphonsus Sias was his PCP.

## 2020-12-31 NOTE — Telephone Encounter (Signed)
Unable to reach pt by phone; sending note to Gillis Santa.
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
## 2020-12-31 NOTE — Telephone Encounter (Signed)
[facility] Night - Client Nonclinical Telephone Record AccessNurse Client [facility] Night - Client Client Site [facility] - Night Physician Tillman Abide- MD Contact Type Call Who Is Calling Patient / Member / Family / Caregiver Caller Name Zorian Gunderman Caller Phone Number [phone] Patient Name Charles Copeland Patient DOB 12-Jan-1996 Call Type Message Only Information Provided Reason for Call Request for General Office Information Initial Comment Caller states he would like a returned call from the office. Additional Comment Caller was provided office hours. Caller declined triage. Disp. Time Disposition Final User 12/30/2020 5:03:49 PM General Information Provided Yes Leanne Lovely Call Closed By: Leanne Lovely Transaction Date/Time: 12/30/2020 5:01:58 PM (ET)

## 2021-05-12 ENCOUNTER — Other Ambulatory Visit: Payer: Self-pay | Admitting: Family Medicine

## 2021-05-12 DIAGNOSIS — N5089 Other specified disorders of the male genital organs: Secondary | ICD-10-CM

## 2021-05-13 ENCOUNTER — Ambulatory Visit: Payer: Self-pay | Admitting: Urology

## 2021-05-14 ENCOUNTER — Encounter: Payer: Self-pay | Admitting: Urology

## 2021-05-14 ENCOUNTER — Ambulatory Visit: Payer: Managed Care, Other (non HMO) | Admitting: Urology

## 2021-05-14 ENCOUNTER — Other Ambulatory Visit: Payer: Self-pay

## 2021-05-14 VITALS — BP 113/75 | HR 95 | Ht 74.0 in | Wt 135.0 lb

## 2021-05-14 DIAGNOSIS — N5089 Other specified disorders of the male genital organs: Secondary | ICD-10-CM

## 2021-05-15 ENCOUNTER — Encounter: Payer: Self-pay | Admitting: Urology

## 2021-05-15 NOTE — Progress Notes (Signed)
05/14/2021 10:08 PM   Charles Copeland Jun 06, 1996 [id number]  Referring provider: [name], MD [address] [facility] [place],  [place] [address]  Chief Complaint  Patient presents with   Other    Scrotal mass    HPI: Charles Copeland is a 25 y.o. male who presents for evaluation of a scrotal mass.  1 week history of a right scrotal mass noted on self-exam Mild tenderness Seen [facility] Scrotal ultrasound ordered and urology evaluation recommended No prior history urologic problems   PMH: Past Medical History:  Diagnosis Date   Allergy    Anxiety    Asperger's syndrome    Asthma    as a child   Depression    GERD (gastroesophageal reflux disease)    Mitral valve prolapse    Pectus excavatum    Seizures (HCC)    an 2019 - medication caused seizure per pt   Syncope     Surgical History: Past Surgical History:  Procedure Laterality Date   nasal cautery  02/09   in OR   NASAL SEPTUM SURGERY  7/13   [facility]--Dr Evans   TONSILLECTOMY AND ADENOIDECTOMY  2007    Home Medications:  Allergies as of 05/14/2021       Reactions   Lexapro [escitalopram] Other (See Comments)   Hypotension, syncope   Remeron [mirtazapine] Other (See Comments)   Hypotension, passed out   Diazepam Other (See Comments)   "Sleeping spells - confusion" "Sleeping spells - confusion"   Geodon [ziprasidone] Other (See Comments)   Syncope, Hypotension   Ibuprofen Nausea Only   Palpitations too   Other Hives   Lays Potato Chips   Quetiapine Fumarate Swelling   Excessive appetite  Stiff zombie   Seroquel [quetiapine Fumarate] Swelling   Excessive appetite  Stiff zombie   Aripiprazole Other (See Comments)   tics        Medication List        Accurate as of May 14, 2021 11:59 PM. If you have any questions, ask your nurse or doctor.          ibuprofen 200 MG tablet Commonly known as: ADVIL Take 200 mg by mouth every 6 (six)  hours as needed for mild pain.   propranolol 10 MG tablet Commonly known as: INDERAL Take 1 tablet (10 mg total) by mouth 3 (three) times daily as needed. For palpitations        Allergies:  Allergies  Allergen Reactions   Lexapro [Escitalopram] Other (See Comments)    Hypotension, syncope   Remeron [Mirtazapine] Other (See Comments)    Hypotension, passed out   Diazepam Other (See Comments)    "Sleeping spells - confusion" "Sleeping spells - confusion"   Geodon [Ziprasidone] Other (See Comments)    Syncope, Hypotension   Ibuprofen Nausea Only    Palpitations too   Other Hives    Lays Potato Chips   Quetiapine Fumarate Swelling    Excessive appetite  Stiff zombie   Seroquel [Quetiapine Fumarate] Swelling    Excessive appetite  Stiff zombie   Aripiprazole Other (See Comments)    tics    Family History: Family History  Problem Relation Age of Onset   Asthma Mother    GER disease Mother    Eczema Mother    Allergies Mother    Colon polyps Mother    Hyperlipidemia Mother    Hyperlipidemia Father    Crohn's disease Brother  Mitral valve prolapse Brother    Anxiety disorder Brother    Alcohol abuse Maternal Grandfather        ? bipolar   Liver cancer Paternal Grandfather        LIVER   Sarcoidosis Maternal Uncle    Autoimmune disease Maternal Uncle        hepatitis   Hepatitis Maternal Aunt        autoimmune   Graves' disease Maternal Aunt    Thyroid disease Maternal Aunt    Sarcoidosis Maternal Aunt    Esophageal cancer Neg Hx    Rectal cancer Neg Hx    Stomach cancer Neg Hx    Colon cancer Neg Hx     Social History:  reports that he has never smoked. He has never used smokeless tobacco. He reports that he does not drink alcohol and does not use drugs.   Physical Exam: BP 113/75   Pulse 95   Ht 6\' 2"  (1.88 m)   Wt 135 lb (61.2 kg)   BMI 17.33 kg/m   Constitutional:  Alert and oriented, No acute distress. HEENT: [place] AT, moist mucus membranes.   Trachea midline, no masses. Cardiovascular: No clubbing, cyanosis, or edema. Respiratory: Normal respiratory effort, no increased work of breathing. GU: Phallus without lesions, testes descended bilaterally without masses or tenderness.  Slightly firm BB sized mass right epididymis Skin: No rashes, bruises or suspicious lesions. Neurologic: Grossly intact, no focal deficits, moving all 4 extremities. Psychiatric: Normal mood and affect.   Assessment & Plan:    1.  Right epididymal mass Palpably benign Most likely a small spermatocele or calcified appendix epididymis Scrotal sonogram has been ordered and scheduled 05/26/2021   05/28/2021, MD  [facility] [address] [place], [place] [id number]

## 2021-05-26 ENCOUNTER — Ambulatory Visit: Payer: Managed Care, Other (non HMO)

## 2021-09-07 ENCOUNTER — Emergency Department
Admission: EM | Admit: 2021-09-07 | Discharge: 2021-09-07 | Disposition: A | Discharge status: Home / Self Care | Payer: Managed Care, Other (non HMO) | Attending: Emergency Medicine | Admitting: Emergency Medicine

## 2021-09-07 ENCOUNTER — Encounter: Payer: Self-pay | Admitting: Emergency Medicine

## 2021-09-07 ENCOUNTER — Other Ambulatory Visit: Payer: Self-pay

## 2021-09-07 DIAGNOSIS — R112 Nausea with vomiting, unspecified: Secondary | ICD-10-CM | POA: Diagnosis present

## 2021-09-07 DIAGNOSIS — E860 Dehydration: Secondary | ICD-10-CM | POA: Diagnosis not present

## 2021-09-07 LAB — URINALYSIS, ROUTINE W REFLEX MICROSCOPIC
Bilirubin Urine: NEGATIVE
Glucose, UA: NEGATIVE mg/dL
Hgb urine dipstick: NEGATIVE
Ketones, ur: 5 mg/dL — AB
Leukocytes,Ua: NEGATIVE
Nitrite: NEGATIVE
Protein, ur: NEGATIVE mg/dL
Specific Gravity, Urine: 1.025 (ref 1.005–1.030)
pH: 5 (ref 5.0–8.0)

## 2021-09-07 MED ORDER — ONDANSETRON HCL 4 MG/2ML IJ SOLN
4.0000 mg | Freq: Once | INTRAMUSCULAR | Status: DC
  Filled 2021-09-07: qty 2

## 2021-09-07 MED ORDER — ONDANSETRON 4 MG PO TBDP: 4.0000 mg | ORAL_TABLET | Freq: Three times a day (TID) | ORAL | 0 refills | Status: AC | PRN

## 2021-09-07 MED ORDER — LACTATED RINGERS IV BOLUS: 1000.0000 mL | Freq: Once | INTRAVENOUS | Status: DC

## 2021-09-07 NOTE — ED Notes (Signed)
Dr. Erma Heritage at bedside. Pt requesting no IV for bloodwork or medications.
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
## 2021-09-07 NOTE — ED Notes (Signed)
See triage note  presents with some n/v  states he thinks he ate something yesterday that was bad  last time vomiting was this am   low grade temp on arrival  pt is currently on Amoxil for bronchitis

## 2021-09-07 NOTE — ED Triage Notes (Signed)
Pt states that he ate some beans last pm that tasted bad and states that he has vomited 4 times this am and denies any diarrhea, pt states that is currently taking amoxicillin for a cough. Pt states that he is living with his parents and thinks that they may have made him sick with the beans. Pt has a Veterinary surgeon fred [employer] [phone]

## 2022-04-18 ENCOUNTER — Ambulatory Visit (HOSPITAL_BASED_OUTPATIENT_CLINIC_OR_DEPARTMENT_OTHER): Payer: 59 | Admitting: Student in an Organized Health Care Education/Training Program

## 2022-04-18 ENCOUNTER — Encounter (HOSPITAL_COMMUNITY): Payer: Self-pay | Admitting: Student in an Organized Health Care Education/Training Program

## 2022-04-18 DIAGNOSIS — F840 Autistic disorder: Secondary | ICD-10-CM | POA: Diagnosis not present

## 2022-04-18 DIAGNOSIS — F339 Major depressive disorder, recurrent, unspecified: Secondary | ICD-10-CM | POA: Diagnosis not present

## 2022-04-18 DIAGNOSIS — F902 Attention-deficit hyperactivity disorder, combined type: Secondary | ICD-10-CM

## 2022-04-18 DIAGNOSIS — F411 Generalized anxiety disorder: Secondary | ICD-10-CM | POA: Diagnosis not present
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
## 2022-05-25 ENCOUNTER — Ambulatory Visit (HOSPITAL_COMMUNITY): Payer: 59 | Admitting: Licensed Clinical Social Worker

## 2022-05-25 DIAGNOSIS — F411 Generalized anxiety disorder: Secondary | ICD-10-CM | POA: Diagnosis not present

## 2022-05-25 DIAGNOSIS — F339 Major depressive disorder, recurrent, unspecified: Secondary | ICD-10-CM

## 2022-05-25 DIAGNOSIS — F902 Attention-deficit hyperactivity disorder, combined type: Secondary | ICD-10-CM

## 2022-05-26 ENCOUNTER — Encounter (HOSPITAL_COMMUNITY): Payer: Self-pay
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
## 2022-05-26 NOTE — Plan of Care (Signed)
  Problem: Depression Goal:  Decrease depressive symptoms and improve levels of effective functioning-pt reports a decrease in overall depression symptoms 3 out of 5 sessions documented.  Outcome: Initial Goal: Develop healthy thinking patterns and beliefs about self, others, and the world that lead to the alleviation and help prevent the relapse of depression per self report 3 out of 5 sessions documented.   Outcome: Initial   Problem: Anxiety  Goal: Reduce overall frequency, intensity, and duration of the anxiety so that daily functioning is not impaired per pt self report 3 out of 5 sessions documented.   Outcome: Initial Goal: Learn and implement coping skills that result in a reduction of anxiety and worry, and improve daily functioning per pt report 3 out of 5 sessions documented  Outcome: Initial   Problem: PTSD symptoms Goal:  Reduce the negative impact trauma related symptoms have on social, occupational, and family functioning per pt self report 3 out of 5 sessions documented.  Outcome: Initial Goal: Reduction in intrusive event recollections, avoidance of event reminders, intense arousal, or disinterest in activities or relationships per pt self report 3 out of 5 sessions documented Outcome: Initial

## 2022-07-06 ENCOUNTER — Ambulatory Visit (HOSPITAL_COMMUNITY): Payer: 59 | Admitting: Licensed Clinical Social Worker

## 2022-07-06 DIAGNOSIS — F411 Generalized anxiety disorder: Secondary | ICD-10-CM | POA: Diagnosis not present

## 2022-07-06 DIAGNOSIS — F339 Major depressive disorder, recurrent, unspecified: Secondary | ICD-10-CM

## 2022-07-06 NOTE — Progress Notes (Signed)
THERAPIST PROGRESS NOTE  Session Time: 3-4p  [place] in office visit for patient and LCSW clinician  Participation Level: Active  Behavioral Response: Neat and Well GroomedAlertAnxious  Type of Therapy: Individual Therapy  Treatment Goals addressed:  Problem: Depression Goal:  Decrease depressive symptoms and improve levels of effective functioning-pt reports a decrease in overall depression symptoms 3 out of 5 sessions documented.  Outcome: Progressing Goal: Develop healthy thinking patterns and beliefs about self, others, and the world that lead to the alleviation and help prevent the relapse of depression per self report 3 out of 5 sessions documented.   Outcome: Progressing   Problem: Anxiety  Goal: Reduce overall frequency, intensity, and duration of the anxiety so that daily functioning is not impaired per pt self report 3 out of 5 sessions documented.   Outcome: Progressing Goal: Learn and implement coping skills that result in a reduction of anxiety and worry, and improve daily functioning per pt report 3 out of 5 sessions documented  Outcome: Progressing Intervention: Encourage patient to identify triggers Note: Reviewed  Intervention: Assist with coping skills and behavior Note: Reviewed   ProgressTowards Goals: Progressing  Interventions: CBT, Solution Focused, Supportive, and Family Systems  Summary: Charles Copeland is a 26 y.o. male who presents with continuing symptoms related to depression and anxiety. Pt reports that he is getting good quality and quantity of sleep.  Allowed pt to explore and express thoughts and feelings associated with recent life situations and external stressors. Pt continues to explore relationships with mother, father, and brother. Pt reports that the relationship between himself and mother is strained, but functional. Pt feels that his mother labels him whenever he asks questions or wants to talk about  something--pt states that recently she said he was "manic" and that she was going to have him committed if he keeps talking about a subject. Clinician attempted to identify pts behavior at the time that triggered mother's response but pt insists that he was just asking questions--not repeatedly, not with anger, etc.   Pt explored situations/scenarios from the past that have been emotionally triggering. LCSW clinician attempted to catch and assist pt with cognitive distortions--concept was difficult for pt to understand at time of session.   Pt states that family recently moved into a new home, and the adjustment has been difficult for pt. "It just doesn't feel like my own space". Initially parents told pt that the move to the new home would give him more independence, but pt doesn't feel that way currently.  Reviewed coping skills for managing depression feelings and emotion regulation strategies. Pt reflects understanding.  Continued recommendations are as follows: self care behaviors, positive social engagements, focusing on overall work/home/life balance, and focusing on positive physical and emotional wellness.    Suicidal/Homicidal: No  Therapist Response: Pt is continuing to apply interventions learned in session into daily life situations. Pt is currently on track to meet goals utilizing interventions mentioned above. Personal growth and progress noted. Treatment to continue as indicated.   Plan: Return again in 4 weeks.  Diagnosis:  Encounter Diagnoses  Name Primary?   Recurrent major depressive disorder, remission status unspecified ([place]) Yes   Generalized anxiety disorder    Collaboration of Care: Other Pt encouraged to continue care with psychiatrist of record, Dr. Lestine Mount  Patient/Guardian was advised Release of Information must be obtained prior to any record release in order to collaborate their care with an outside provider. Patient/Guardian was advised if they  have  not already done so to contact the registration department to sign all necessary forms in order for [place] to release information regarding their care.   Consent: Patient/Guardian gives verbal consent for treatment and assignment of benefits for services provided during this visit. Patient/Guardian expressed understanding and agreed to proceed.   Ernest Haber Imunique Samad, LCSW 07/06/2022

## 2022-07-07 NOTE — Plan of Care (Signed)
  Problem: Depression Goal:  Decrease depressive symptoms and improve levels of effective functioning-pt reports a decrease in overall depression symptoms 3 out of 5 sessions documented.  Outcome: Progressing Goal: Develop healthy thinking patterns and beliefs about self, others, and the world that lead to the alleviation and help prevent the relapse of depression per self report 3 out of 5 sessions documented.   Outcome: Progressing   Problem: Anxiety  Goal: Reduce overall frequency, intensity, and duration of the anxiety so that daily functioning is not impaired per pt self report 3 out of 5 sessions documented.   Outcome: Progressing Goal: Learn and implement coping skills that result in a reduction of anxiety and worry, and improve daily functioning per pt report 3 out of 5 sessions documented  Outcome: Progressing Intervention: Encourage patient to identify triggers Note: Reviewed  Intervention: Assist with coping skills and behavior Note: Reviewed

## 2022-08-17 ENCOUNTER — Ambulatory Visit (HOSPITAL_COMMUNITY): Payer: 59 | Admitting: Licensed Clinical Social Worker

## 2022-08-17 DIAGNOSIS — F339 Major depressive disorder, recurrent, unspecified: Secondary | ICD-10-CM | POA: Diagnosis not present

## 2022-08-17 DIAGNOSIS — F840 Autistic disorder: Secondary | ICD-10-CM | POA: Diagnosis not present

## 2022-08-17 DIAGNOSIS — F411 Generalized anxiety disorder: Secondary | ICD-10-CM | POA: Diagnosis not present

## 2022-08-17 DIAGNOSIS — F902 Attention-deficit hyperactivity disorder, combined type: Secondary | ICD-10-CM | POA: Diagnosis not present

## 2022-08-18 NOTE — Progress Notes (Signed)
THERAPIST PROGRESS NOTE  Session Time: 3-4p  Behavioral Health Outpatient [facility] in office visit for patient and LCSW clinician  Participation Level: Active  Behavioral Response: Neat and Well GroomedAlertAnxious  Type of Therapy: Individual Therapy  Treatment Goals addressed:   Learn and implement coping skills that result in a reduction of anxiety and worry, and improve daily functioning per pt report 3 out of 5 sessions documented   Reduce the negative impact trauma related symptoms have on social, occupational, and family functioning per pt self report 3 out of 5 sessions documented.  ProgressTowards Goals: Progressing  Interventions: CBT, Solution Focused, Supportive, and Family Systems  Summary: Charles Copeland is a 26 y.o. male who presents with continuing symptoms related to depression and anxiety. Pt reports that he is getting good quality and quantity of sleep.  Allowed pt to explore and express thoughts and feelings associated with recent life situations and external stressors. Patient reports improving symptoms related to depression, anxiety, ADHD. Patient reports that he still has ongoing stress associated with relationships between himself and his parents. Patient reports that his brother gets upset and tells him just to go with the flow and do things that his parents wants him to do. Patient reports that sometimes he feels he just goes through the motions and says things that his parents want to hear and does things that other people want him to do, but he doesn't really feel that way. "Deep down inside I really hate them--does that make me a bad person?". allowed patient safe space for him to explore his real feelings about his parents.  Patient reports that school is going very well, and that he has a very high grade point average. Patient reports that next semester he's taking two math classes, a biology class, and a computer class. Patient reports that his primary  goal is to finish school, and have the ability to leave the house.  Patient continued to discuss the trauma associated with the relationships with both parents, and incidents that have happened in the past. Assisted patient with identifying feelings, and identifying triggers. Reviewed coping skills for management of depression symptoms and anxiety symptoms. Discussed verbal and nonverbal communication cues. Patient reflects understanding and cooperation.  Patient denies any suicidal ideation towards himself or any homicidal ideation towards anyone else. Patient denies any perceptual disturbances at time of session.  Continued recommendations are as follows: self care behaviors, positive social engagements, focusing on overall work/home/life balance, and focusing on positive physical and emotional wellness.    Suicidal/Homicidal: No  Therapist Response: Pt is continuing to apply interventions learned in session into daily life situations. Pt is currently on track to meet goals utilizing interventions mentioned above. Personal growth and progress noted. Treatment to continue as indicated.   Plan: Return again in 4 weeks.  Diagnosis:  Encounter Diagnoses  Name Primary?   Recurrent major depressive disorder, remission status unspecified (HCC) Yes   Generalized anxiety disorder    Attention deficit hyperactivity disorder (ADHD), combined type    Autistic disorder, active    Collaboration of Care: Other Pt encouraged to continue care with psychiatrist of record, Dr. Rhea Belton  Patient/Guardian was advised Release of Information must be obtained prior to any record release in order to collaborate their care with an outside provider. Patient/Guardian was advised if they have not already done so to contact the registration department to sign all necessary forms in order for [place] to release information regarding their care.   Consent:  Patient/Guardian gives verbal consent for treatment and  assignment of benefits for services provided during this visit. Patient/Guardian expressed understanding and agreed to proceed.   Ernest Haber Corazon Nickolas, LCSW 08/18/2022

## 2022-10-06 ENCOUNTER — Ambulatory Visit (HOSPITAL_COMMUNITY): Payer: 59 | Admitting: Licensed Clinical Social Worker

## 2022-10-06 ENCOUNTER — Telehealth (HOSPITAL_COMMUNITY): Payer: 59 | Admitting: Licensed Clinical Social Worker

## 2022-10-06 DIAGNOSIS — F411 Generalized anxiety disorder: Secondary | ICD-10-CM

## 2022-10-06 DIAGNOSIS — F339 Major depressive disorder, recurrent, unspecified: Secondary | ICD-10-CM | POA: Diagnosis not present

## 2022-10-06 DIAGNOSIS — F840 Autistic disorder: Secondary | ICD-10-CM

## 2022-10-06 DIAGNOSIS — F902 Attention-deficit hyperactivity disorder, combined type: Secondary | ICD-10-CM | POA: Diagnosis not present
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
## 2022-11-10 ENCOUNTER — Ambulatory Visit (INDEPENDENT_AMBULATORY_CARE_PROVIDER_SITE_OTHER): Payer: 59 | Admitting: Licensed Clinical Social Worker

## 2022-11-10 DIAGNOSIS — F339 Major depressive disorder, recurrent, unspecified: Secondary | ICD-10-CM | POA: Diagnosis not present

## 2022-11-10 DIAGNOSIS — F902 Attention-deficit hyperactivity disorder, combined type: Secondary | ICD-10-CM | POA: Diagnosis not present

## 2022-11-10 DIAGNOSIS — F411 Generalized anxiety disorder: Secondary | ICD-10-CM

## 2022-11-10 DIAGNOSIS — F840 Autistic disorder: Secondary | ICD-10-CM
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
## 2023-01-12 ENCOUNTER — Ambulatory Visit (INDEPENDENT_AMBULATORY_CARE_PROVIDER_SITE_OTHER): Payer: 59 | Admitting: Licensed Clinical Social Worker

## 2023-01-12 DIAGNOSIS — F339 Major depressive disorder, recurrent, unspecified: Secondary | ICD-10-CM | POA: Diagnosis not present

## 2023-01-12 DIAGNOSIS — F902 Attention-deficit hyperactivity disorder, combined type: Secondary | ICD-10-CM | POA: Diagnosis not present

## 2023-01-12 NOTE — Progress Notes (Signed)
   THERAPIST PROGRESS NOTE  Session Time: 3-4p  Behavioral Health Outpatient [facility] in office visit for patient and LCSW clinician  Participation Level: Active  Behavioral Response: Neat and Well GroomedAlertAnxious  Type of Therapy: Individual Therapy  Treatment Goals addressed:   Learn and implement coping skills that result in a reduction of anxiety and worry, and improve daily functioning per pt report 3 out of 5 sessions documented   Reduce the negative impact trauma related symptoms have on social, occupational, and family functioning per pt self report 3 out of 5 sessions documented.  ProgressTowards Goals: Progressing  Interventions: CBT, Solution Focused, Supportive, and Family Systems  Summary: Charles Copeland is a 27 y.o. male who presents with continuing symptoms related to depression and anxiety. Pt reports that he is getting good quality and quantity of sleep.  Discussed current schoolwork--allowed pt to explore time management, organizational skills, and ways that they are managing school-related stress. Pt feels good about current assignment/grades. Pt states that he ended up with all A's at the end of semester. Pt reports that he is accepted/registered at [employer] for the fall and will be living on campus. Pt is very happy about this.   Explored current family-based issues/concerns. Pt reports that one of the main anxiety triggers related to family relationships is the relationship with his parents. Discussed current ways that pt and family members are communicating, and discussed any recent changes. Discussed current conflict resolution and problem solving behaviors and discussed changes that could improve future conflict/problem solving with family members. Pt reflects understanding and is cooperative.    Pt reports feeling more confident in himself and his overall decisions.   Patient denies any suicidal ideation patient, homicidal ideation, or any perceptual  disturbances at time of session.  Continued recommendations are as follows: self care behaviors, positive social engagements, focusing on overall work/home/life balance, and focusing on positive physical and emotional wellness.    Suicidal/Homicidal: No  Therapist Response: Pt is continuing to apply interventions learned in session into daily life situations. Pt is currently on track to meet goals utilizing interventions mentioned above. Personal growth and progress noted. Treatment to continue as indicated.   Patient is continuing to develop skills to improve family and relational functioning, improve academic achievement, and exploring strengths within self.  Plan: Return again in 4 weeks.  Diagnosis:  Encounter Diagnoses  Name Primary?   Recurrent major depressive disorder, remission status unspecified (HCC) Yes   Attention deficit hyperactivity disorder (ADHD), combined type     Collaboration of Care: Other Pt encouraged to continue care with psychiatrist of record, Dr. Rhea Belton  Patient/Guardian was advised Release of Information must be obtained prior to any record release in order to collaborate their care with an outside provider. Patient/Guardian was advised if they have not already done so to contact the registration department to sign all necessary forms in order for [place] to release information regarding their care.   Consent: Patient/Guardian gives verbal consent for treatment and assignment of benefits for services provided during this visit. Patient/Guardian expressed understanding and agreed to proceed.   Ernest Haber Floyce Bujak, LCSW 01/13/2023

## 2023-03-15 ENCOUNTER — Ambulatory Visit (INDEPENDENT_AMBULATORY_CARE_PROVIDER_SITE_OTHER): Payer: 59 | Admitting: Licensed Clinical Social Worker

## 2023-03-15 DIAGNOSIS — F339 Major depressive disorder, recurrent, unspecified: Secondary | ICD-10-CM

## 2023-03-15 DIAGNOSIS — F411 Generalized anxiety disorder: Secondary | ICD-10-CM
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
## 2023-03-16 NOTE — Patient Instructions (Signed)
Outpatient Psychiatry and Counseling  FOR CRISIS:  call 911, [facility]: Mobile Crisis Management 24 hours:  [phone], call 988, [facility] ([facility]) [address] walk in, or go to your local EMERGENCY DEPARTMENT  [facility] [address], [place], [place] [address]  [phone]  [facility] [address] [place], [place] [address] [phone]  [facility] Address: [address] [address], [place], [place] [address] Phone: [phone]  [facility] ([place] and [place] Locations) [email or web address]  [facility] sliding scale fee and walk in schedule: M-F 8am-12pm/1pm-3pm [address]  [place], [place] [address] [phone]  [facility] [address] [place], [place] [address] [phone]  [facility]/ Intensive Outpatient Therapy Program/CDIOP/PHP [address] [place], [place] [address] [phone]  [facility]                  Crisis Services, Outpatient Therapy Services, Walk in Services      [phone]     [address]    [place], [place] [address]                 [facility]   [facility] [phone] [address] [place], [place] [address]  [facility]          [address],  [place], [place] [address]       [phone]  [facility] [address] [place], [place] [address] [phone]  [facility]    [address]    [place], [place] [address]     [phone]       [facility] [address] [place] [address]  [facility]     [address]     [place], [place]      [phone]       [facility] Shamsher Ahluwalia, MD [address]  [address] [place], [place] [address] [phone]  [facility]     [address]     [place], [place] [address]     [phone]       [facility] [address] ,  [address] [phone] Resources for Temporary Residential Assistance/Crisis Centers
